# Patient Record
Sex: Female | Born: 1990 | Race: Black or African American | Hispanic: No | Marital: Single | Smoking: Never smoker
Health system: Southern US, Community
[De-identification: ages and names within clinical notes are randomized; demographics above are authoritative.]

## PROBLEM LIST (undated history)

## (undated) DIAGNOSIS — Q631 Lobulated, fused and horseshoe kidney: Secondary | ICD-10-CM

## (undated) DIAGNOSIS — F419 Anxiety disorder, unspecified: Secondary | ICD-10-CM

## (undated) DIAGNOSIS — Q520 Congenital absence of vagina: Secondary | ICD-10-CM

## (undated) DIAGNOSIS — F32A Depression, unspecified: Secondary | ICD-10-CM

## (undated) DIAGNOSIS — F329 Major depressive disorder, single episode, unspecified: Secondary | ICD-10-CM

## (undated) HISTORY — PX: DIAGNOSTIC LAPAROSCOPY: SUR761

## (undated) HISTORY — PX: OVARIAN CYST REMOVAL: SHX89

---

## 2010-09-19 ENCOUNTER — Emergency Department (HOSPITAL_COMMUNITY): Admission: EM | Admit: 2010-09-19 | Discharge: 2010-09-19 | Payer: Self-pay | Admitting: Emergency Medicine

## 2010-09-21 ENCOUNTER — Emergency Department (HOSPITAL_COMMUNITY): Admission: EM | Admit: 2010-09-21 | Discharge: 2010-09-21 | Payer: Self-pay | Admitting: Emergency Medicine

## 2011-05-01 ENCOUNTER — Emergency Department (HOSPITAL_COMMUNITY)
Admission: EM | Admit: 2011-05-01 | Discharge: 2011-05-01 | Disposition: A | Discharge status: Home / Self Care | Payer: BC Managed Care – PPO | Attending: Emergency Medicine | Admitting: Emergency Medicine

## 2011-05-01 DIAGNOSIS — Q059 Spina bifida, unspecified: Secondary | ICD-10-CM | POA: Insufficient documentation

## 2011-05-01 DIAGNOSIS — R059 Cough, unspecified: Secondary | ICD-10-CM | POA: Insufficient documentation

## 2011-05-01 DIAGNOSIS — R05 Cough: Secondary | ICD-10-CM | POA: Insufficient documentation

## 2011-05-01 DIAGNOSIS — R071 Chest pain on breathing: Secondary | ICD-10-CM | POA: Insufficient documentation

## 2011-05-02 ENCOUNTER — Emergency Department (HOSPITAL_COMMUNITY): Payer: BC Managed Care – PPO

## 2011-05-02 ENCOUNTER — Emergency Department (HOSPITAL_COMMUNITY)
Admission: EM | Admit: 2011-05-02 | Discharge: 2011-05-02 | Disposition: A | Discharge status: Home / Self Care | Payer: BC Managed Care – PPO | Attending: Emergency Medicine | Admitting: Emergency Medicine

## 2011-05-02 DIAGNOSIS — R059 Cough, unspecified: Secondary | ICD-10-CM | POA: Insufficient documentation

## 2011-05-02 DIAGNOSIS — J069 Acute upper respiratory infection, unspecified: Secondary | ICD-10-CM | POA: Insufficient documentation

## 2011-05-02 DIAGNOSIS — R05 Cough: Secondary | ICD-10-CM | POA: Insufficient documentation

## 2011-05-02 DIAGNOSIS — R071 Chest pain on breathing: Secondary | ICD-10-CM | POA: Insufficient documentation

## 2011-05-02 DIAGNOSIS — Q059 Spina bifida, unspecified: Secondary | ICD-10-CM | POA: Insufficient documentation

## 2011-05-02 DIAGNOSIS — R509 Fever, unspecified: Secondary | ICD-10-CM | POA: Insufficient documentation

## 2011-06-30 ENCOUNTER — Emergency Department (HOSPITAL_COMMUNITY)
Admission: EM | Admit: 2011-06-30 | Discharge: 2011-06-30 | Disposition: A | Discharge status: Home / Self Care | Payer: BC Managed Care – PPO | Attending: Emergency Medicine | Admitting: Emergency Medicine

## 2011-06-30 ENCOUNTER — Emergency Department (HOSPITAL_COMMUNITY): Payer: BC Managed Care – PPO

## 2011-06-30 DIAGNOSIS — R109 Unspecified abdominal pain: Secondary | ICD-10-CM | POA: Insufficient documentation

## 2011-06-30 DIAGNOSIS — R197 Diarrhea, unspecified: Secondary | ICD-10-CM | POA: Insufficient documentation

## 2011-06-30 DIAGNOSIS — N949 Unspecified condition associated with female genital organs and menstrual cycle: Secondary | ICD-10-CM | POA: Insufficient documentation

## 2011-06-30 DIAGNOSIS — Q059 Spina bifida, unspecified: Secondary | ICD-10-CM | POA: Insufficient documentation

## 2011-06-30 DIAGNOSIS — R112 Nausea with vomiting, unspecified: Secondary | ICD-10-CM | POA: Insufficient documentation

## 2011-06-30 LAB — POCT I-STAT, CHEM 8
BUN: 5 mg/dL — ABNORMAL LOW (ref 6–23)
Chloride: 107 mEq/L (ref 96–112)
Creatinine, Ser: 0.7 mg/dL (ref 0.50–1.10)
Glucose, Bld: 118 mg/dL — ABNORMAL HIGH (ref 70–99)
Potassium: 4.3 mEq/L (ref 3.5–5.1)
Sodium: 141 mEq/L (ref 135–145)

## 2011-06-30 MED ORDER — IOHEXOL 300 MG/ML  SOLN
100.0000 mL | Freq: Once | INTRAMUSCULAR | Status: AC | PRN
  Administered 2011-06-30: 100 mL via INTRAVENOUS

## 2012-03-12 ENCOUNTER — Emergency Department (HOSPITAL_COMMUNITY)
Admission: EM | Admit: 2012-03-12 | Discharge: 2012-03-12 | Disposition: A | Discharge status: Home / Self Care | Payer: BC Managed Care – PPO | Attending: Emergency Medicine | Admitting: Emergency Medicine

## 2012-03-12 ENCOUNTER — Encounter (HOSPITAL_COMMUNITY): Payer: Self-pay | Admitting: Emergency Medicine

## 2012-03-12 DIAGNOSIS — J040 Acute laryngitis: Secondary | ICD-10-CM | POA: Insufficient documentation

## 2012-03-12 LAB — RAPID STREP SCREEN (MED CTR MEBANE ONLY): Streptococcus, Group A Screen (Direct): NEGATIVE

## 2012-03-12 MED ORDER — ALBUTEROL SULFATE HFA 108 (90 BASE) MCG/ACT IN AERS
2.0000 | INHALATION_SPRAY | RESPIRATORY_TRACT | Status: DC | PRN
  Administered 2012-03-12: 2 via RESPIRATORY_TRACT
  Filled 2012-03-12: qty 6.7

## 2012-03-12 NOTE — ED Notes (Signed)
Pt c/o HA, sore throat and SOB since last night, states SOB has resolved, denies pain on arrival, NAD

## 2012-03-12 NOTE — Discharge Instructions (Signed)
Laryngitis Your exam shows you have laryngitis. This condition is due to inflammation around the vocal cords and causes hoarseness and cough. Laryngitis can often be related to a virus infection, excessive smoking, excessive talking or yelling, breathing toxic fumes, allergies, or reflux of acid from your stomach. Treatment is mainly voice rest. Talk as little as possible (this includes whispering). Use written notes to communicate until your voice is back to normal. Avoid smoking cigarettes, drink plenty of clear liquids, and rest frequently until all your symptoms have improved. You should be checked by your doctor if your hoarseness and cough are not improved after 5 days. Please see your doctor or go to the emergency right away if you have:  Trouble breathing or blood in your sputum.   Difficulty swallowing, persistent fever or increasing pain.  Document Released: 11/11/2005 Document Revised: 07/24/2011 Document Reviewed: 04/29/2007 [facility] Patient Information 2012 [place], [place].  Salt Water Gargle This solution will help make your mouth and throat feel better. HOME CARE INSTRUCTIONS   Mix 1 teaspoon of salt in 8 ounces of warm water.   Gargle with this solution as much or often as you need or as directed. Swish and gargle gently if you have any sores or wounds in your mouth.   Do not swallow this mixture.  Document Released: 08/15/2004 Document Revised: 10/31/2011 Document Reviewed: 01/06/2009 [facility] Patient Information 2012 [place], [place].  Take 2 puff of albuterol inhaler every 4 hours as needed for shortness of breath.     RESOURCE GUIDE  Dental Problems  Patients with Medicaid: [facility]                     [phone] [address]                                           Phone:  [phone]                                                  If unable to pay or uninsured, contact:  [facility] or [facility]. to become qualified for the  adult dental clinic.  Chronic Pain Problems Contact [facility]  [phone] Patients need to be referred by their primary care doctor.  Insufficient Money for Medicine Contact United Way:  call "211" or [facility] (769)243-4785.  No Primary Care Doctor Call [facility]  [phone] Other agencies that provide inexpensive medical care    [facility]  [phone]    [facility]  [phone]    [facility]  [phone]    [facility]  [phone]    [facility]  [phone]    [facility]  [phone]  Substance Abuse Resources [facility]  [phone] [facility] [phone] [facility] [phone] Floydene Flock [phone] Residential & Outpatient Substance Abuse Program  [phone]  Psychological Services [facility]  [phone] [facility]  [phone] [facility]   [phone] (emergency services [phone])  Abuse/Neglect [facility] Child Abuse Hotline (351)405-4891 [facility] Child Abuse Hotline 202-830-1170 (After Hours)  Emergency Shelter [facility] (801) [phone]  Maternity Homes Room at the [facility] [place] [phone] [employer] Services [phone]  MRSA Hotline #:   208-157-1269    [facility] Resources  [facility]  United [employer]                           [facility] Dept. [address] [place]                     [address]         [address]  Blondell Reveal Phone:  [phone]                                  Phone:  [phone]                   Phone:  [phone]  [facility] Mental Health Phone:  [phone]  [facility] Child Abuse Hotline [phone] (After Hours)

## 2012-03-12 NOTE — ED Provider Notes (Signed)
History     CSN: [id number]  Arrival date & time 03/12/12  4098   First MD Initiated Contact with Patient 03/12/12 1006      Chief Complaint  Patient presents with  . Sore Throat    (Consider location/radiation/quality/duration/timing/severity/associated sxs/prior treatment) The history is provided by the patient. No language interpreter was used.    21 year old female presents with chief complaints of sore throat. Patient states for the past 4 days she has not been feeling good. Initially she was having a throbbing headache affecting her left side of head that lasted for about 2 days it has resolved. She then noticed throat irritation and sore throat. Symptoms are worsening with swallowing. Symptoms improved with drinking honey.  Onset is gradual, and persistent, she also states she is losing her voice. Patient also has several bouts of nonbloody diarrhea 2 days ago but that has resolved. Last night she experiencing chest congestion with increased shortness of breath. This is nonexertional. She has subjective fever and chills. Patient denies chest pain, nausea, vomiting, abdominal pain, dysuria. She denies sneezing, coughing, runny nose, or ear pain. Patient has a change in her activities. She also denies taking birth control pill, recent surgery, or prolonged bed rest. She denies calf pain or leg swelling. She denies any environmental changes.  No past medical history on file.  No past surgical history on file.  No family history on file.  History  Substance Use Topics  . Smoking status: Not on file  . Smokeless tobacco: Not on file  . Alcohol Use: Not on file    OB History    No data available      Review of Systems  All other systems reviewed and are negative.    Allergies  Review of patient's allergies indicates not on file.  Home Medications  No current outpatient prescriptions on file.  BP 122/90  Pulse 68  Temp(Src) 97.5 F (36.4 C) (Oral)  Resp 22  SpO2  99%  Physical Exam  Nursing note and vitals reviewed. Constitutional: She is oriented to person, place, and time. She appears well-developed and well-nourished. No distress.       Awake, alert, nontoxic appearance  HENT:  Head: Normocephalic and atraumatic.  Right Ear: External ear normal.  Left Ear: External ear normal.  Nose: Nose normal.  Mouth/Throat: Oropharynx is clear and moist. No oropharyngeal exudate.  Eyes: Conjunctivae are normal. Right eye exhibits no discharge. Left eye exhibits no discharge.  Neck: Neck supple.  Cardiovascular: Normal rate and regular rhythm.   Pulmonary/Chest: Effort normal. No respiratory distress. She has no wheezes. She has no rales. She exhibits no tenderness.  Abdominal: Soft. There is no tenderness. There is no rebound.  Musculoskeletal: She exhibits no tenderness.       ROM appears intact, no obvious focal weakness  Lymphadenopathy:    She has no cervical adenopathy.  Neurological: She is alert and oriented to person, place, and time.       Mental status and motor strength appears intact  Skin: Skin is warm. No rash noted.  Psychiatric: She has a normal mood and affect.    ED Course  Procedures (including critical care time)  Labs Reviewed - No data to display No results found.   No diagnosis found.  Results for orders placed during the hospital encounter of 03/12/12  RAPID STREP SCREEN      Component Value Range   Streptococcus, Group A Screen (Direct) NEGATIVE  NEGATIVE    No  results found.    MDM  Patient presents for sore throat, throat examination was unremarkable. No evidence of PTA, or Ludwig's angina.  Patient does sound a bit hoarsed.  Initially i plan to give prednisone and magic mouthwash, but there are possibility of allergic rxn.  Pt refuse prednisone.  I recommend gargle with salt water.  Will obtain rapid strep test.  Lung examination were unremarkable, pt in NAD resp distress and satting @ 99% on RA.  Will give  albuterol HFA as needed.    Pt voice understanding.          Fayrene Helper, PA-C 03/12/12 1102

## 2012-03-13 NOTE — ED Provider Notes (Signed)
Medical screening examination/treatment/procedure(s) were performed by non-physician practitioner and as supervising physician I was immediately available for consultation/collaboration.  Raeford Razor, MD 03/13/12 [phone]

## 2012-07-13 ENCOUNTER — Encounter (HOSPITAL_COMMUNITY): Payer: Self-pay | Admitting: *Deleted

## 2012-07-13 ENCOUNTER — Inpatient Hospital Stay (HOSPITAL_COMMUNITY)
Admission: AD | Admit: 2012-07-13 | Discharge: 2012-07-13 | Disposition: A | Discharge status: Home / Self Care | Payer: BC Managed Care – PPO | Source: Ambulatory Visit | Attending: Obstetrics & Gynecology | Admitting: Obstetrics & Gynecology

## 2012-07-13 DIAGNOSIS — R300 Dysuria: Secondary | ICD-10-CM | POA: Insufficient documentation

## 2012-07-13 DIAGNOSIS — N390 Urinary tract infection, site not specified: Secondary | ICD-10-CM | POA: Insufficient documentation

## 2012-07-13 DIAGNOSIS — R109 Unspecified abdominal pain: Secondary | ICD-10-CM | POA: Insufficient documentation

## 2012-07-13 HISTORY — DX: Lobulated, fused and horseshoe kidney: Q63.1

## 2012-07-13 HISTORY — DX: Congenital absence of vagina: Q52.0

## 2012-07-13 LAB — URINALYSIS, ROUTINE W REFLEX MICROSCOPIC
Glucose, UA: NEGATIVE mg/dL
Nitrite: NEGATIVE
Protein, ur: 30 mg/dL — AB
pH: 7 (ref 5.0–8.0)

## 2012-07-13 LAB — URINE MICROSCOPIC-ADD ON

## 2012-07-13 MED ORDER — SULFAMETHOXAZOLE-TRIMETHOPRIM 800-160 MG PO TABS: 1.0000 | ORAL_TABLET | Freq: Two times a day (BID) | ORAL | Status: AC

## 2012-07-13 NOTE — MAU Provider Note (Signed)
History     CSN: [id number]  Arrival date and time: 07/13/12 4098   First Provider Initiated Contact with Patient 07/13/12 2142      Chief Complaint  Patient presents with  . Urinary Tract Infection   HPI 21 y.o. presents to MAU with suprapubic pain and dysuria x2 days.  She denies h/a, dizziness, or fever/chills.  Pt has hx of vaginal agenesis.     Past Medical History  Diagnosis Date  . Spina bifida   . Congenital fusion of kidneys   . Vaginal agenesis, total     Past Surgical History  Procedure Date  . Diagnostic laparoscopy   . Ovarian cyst removal     History reviewed. No pertinent family history.  History  Substance Use Topics  . Smoking status: Never Smoker   . Smokeless tobacco: Not on file  . Alcohol Use: No    Allergies:  Allergies  Allergen Reactions  . Fentanyl Anaphylaxis  . Midazolam Hcl Anaphylaxis  . Antihistamines, Chlorpheniramine-Type Other (See Comments)    Hallucinate   . Oxycodone Hcl Er Other (See Comments)    Hallucinations     Prescriptions prior to admission  Medication Sig Dispense Refill  . OVER THE COUNTER MEDICATION Take 1 tablet by mouth daily. Whole [employer] Vegan Multivitamin.        Review of Systems  Constitutional: Negative for fever, chills and malaise/fatigue.  Eyes: Negative for blurred vision.  Respiratory: Negative for cough and shortness of breath.   Cardiovascular: Negative for chest pain.  Gastrointestinal: Positive for abdominal pain. Negative for heartburn, nausea and vomiting.  Genitourinary: Positive for dysuria, urgency, frequency and hematuria.  Musculoskeletal: Negative.   Neurological: Negative for dizziness and headaches.  Psychiatric/Behavioral: Negative for depression.   Physical Exam   Blood pressure 115/76, pulse 65, temperature 97.9 F (36.6 C), temperature source Oral, resp. rate 18, height 5\' 4"  (1.626 m), weight 69.854 kg (154 lb), SpO2 100.00%.  Physical Exam  Nursing note and  vitals reviewed. Constitutional: She is oriented to person, place, and time. She appears well-developed and well-nourished.  Neck: Normal range of motion.  Cardiovascular: Normal rate, regular rhythm and normal heart sounds.   Respiratory: Effort normal.  GI: Soft. There is tenderness.       Suprapubic tenderness  Musculoskeletal: Normal range of motion.  Neurological: She is alert and oriented to person, place, and time.  Skin: Skin is warm and dry.  Psychiatric: She has a normal mood and affect. Her behavior is normal. Judgment and thought content normal.  Negative CVA tenderness  Results for orders placed during the hospital encounter of 07/13/12 (from the past 24 hour(s))  URINALYSIS, ROUTINE W REFLEX MICROSCOPIC     Status: Abnormal   Collection Time   07/13/12  7:30 PM      Component Value Range   Color, Urine YELLOW  YELLOW   APPearance HAZY (*) CLEAR   Specific Gravity, Urine 1.020  1.005 - 1.030   pH 7.0  5.0 - 8.0   Glucose, UA NEGATIVE  NEGATIVE mg/dL   Hgb urine dipstick LARGE (*) NEGATIVE   Bilirubin Urine NEGATIVE  NEGATIVE   Ketones, ur NEGATIVE  NEGATIVE mg/dL   Protein, ur 30 (*) NEGATIVE mg/dL   Urobilinogen, UA 0.2  0.0 - 1.0 mg/dL   Nitrite NEGATIVE  NEGATIVE   Leukocytes, UA MODERATE (*) NEGATIVE  URINE MICROSCOPIC-ADD ON     Status: Abnormal   Collection Time   07/13/12  7:30 PM  Component Value Range   Squamous Epithelial / LPF FEW (*) RARE   WBC, UA TOO NUMEROUS TO COUNT  <3 WBC/hpf   RBC / HPF TOO NUMEROUS TO COUNT  <3 RBC/hpf   Bacteria, UA RARE  RARE   Urine-Other MUCOUS PRESENT     MAU Course  Procedures   Assessment and Plan  Uncomplicated UTI  D/C home Bactrim DS x3 days Drink plenty of fluids Urine sent for culture Return to MAU as needed  LEFTWICH-KIRBY, Hatem Cull 07/13/2012, 9:51 PM

## 2012-07-13 NOTE — MAU Note (Signed)
Pt in c/o constant pressure and pain when urinating.  Reports blood in urine.  States symptoms started 2-3 days ago.  Does not have a uterus or vaginal opening.  Denies any discharge.

## 2012-07-13 NOTE — MAU Note (Signed)
Pt reports "i believe i have a UTI", states she has never had a period due to medical condition (see past medical history). Blood in urine today and lower abd pressure

## 2012-07-14 NOTE — MAU Provider Note (Signed)
Medical Screening exam and patient care preformed by advanced practice provider.  Agree with the above management.  

## 2012-07-16 ENCOUNTER — Inpatient Hospital Stay (HOSPITAL_COMMUNITY)
Admission: AD | Admit: 2012-07-16 | Discharge: 2012-07-16 | Disposition: A | Discharge status: Home / Self Care | Payer: BC Managed Care – PPO | Source: Ambulatory Visit | Attending: Obstetrics & Gynecology | Admitting: Obstetrics & Gynecology

## 2012-07-16 ENCOUNTER — Encounter (HOSPITAL_COMMUNITY): Payer: Self-pay | Admitting: *Deleted

## 2012-07-16 ENCOUNTER — Inpatient Hospital Stay (HOSPITAL_COMMUNITY): Payer: BC Managed Care – PPO

## 2012-07-16 DIAGNOSIS — Q520 Congenital absence of vagina: Secondary | ICD-10-CM | POA: Insufficient documentation

## 2012-07-16 DIAGNOSIS — R109 Unspecified abdominal pain: Secondary | ICD-10-CM | POA: Insufficient documentation

## 2012-07-16 DIAGNOSIS — R1084 Generalized abdominal pain: Secondary | ICD-10-CM

## 2012-07-16 DIAGNOSIS — R110 Nausea: Secondary | ICD-10-CM | POA: Insufficient documentation

## 2012-07-16 DIAGNOSIS — Q631 Lobulated, fused and horseshoe kidney: Secondary | ICD-10-CM

## 2012-07-16 DIAGNOSIS — Q638 Other specified congenital malformations of kidney: Secondary | ICD-10-CM | POA: Insufficient documentation

## 2012-07-16 DIAGNOSIS — M7918 Myalgia, other site: Secondary | ICD-10-CM

## 2012-07-16 LAB — URINALYSIS, ROUTINE W REFLEX MICROSCOPIC
Bilirubin Urine: NEGATIVE
Glucose, UA: NEGATIVE mg/dL
Hgb urine dipstick: NEGATIVE
Specific Gravity, Urine: 1.015 (ref 1.005–1.030)
Urobilinogen, UA: 0.2 mg/dL (ref 0.0–1.0)

## 2012-07-16 MED ORDER — ONDANSETRON 4 MG PO TBDP
4.0000 mg | ORAL_TABLET | Freq: Once | ORAL | Status: AC
  Administered 2012-07-16: 4 mg via ORAL
  Filled 2012-07-16: qty 1

## 2012-07-16 NOTE — MAU Note (Signed)
Pt states she was here 3 days ago and given Sulfa for a UTI-she has taken a total of 6 doses-states she has been nauseated the whole time but today around 1015 started having abd pain that is constant

## 2012-07-16 NOTE — MAU Provider Note (Signed)
Chief Complaint: Abdominal Pain   First Provider Initiated Contact with Patient 07/16/12 2005     SUBJECTIVE HPI: Jacqueline Andrews is a 21 y.o. who presents with diffuse constant lower abd pain x 1d and nausea x 3d since beginning tx with Bactrim for uncomplicated UTI dx here 3d ago. 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 The pain is throbbing and alleviated somewhat after voiding.Feels similar t owhen she had an ovarian cyst.  Urinary frequency, urgency, hematuria resolved. She is eating normally. Normal BMs.   Past Medical History  Diagnosis Date  . Spina bifida   . Vaginal agenesis, total   . Congenital fusion of kidneys   No hx UTIs  OB History    Grav Para Term Preterm Abortions TAB SAB Ect Mult Living                 Past Surgical History  Procedure Date  . Diagnostic laparoscopy   . Ovarian cyst removal    History   Social History  . Marital Status: Single    Spouse Name: N/A    Number of Children: N/A  . Years of Education: N/A   Occupational History  . Not on file.   Social History Main Topics  . Smoking status: Never Smoker   . Smokeless tobacco: Not on file  . Alcohol Use: No  . Drug Use: No  . Sexually Active: No   Other Topics Concern  . Not on file   Social History Narrative  . No narrative on file   No current facility-administered medications on file prior to encounter.   Current Outpatient Prescriptions on File Prior to Encounter  Medication Sig Dispense Refill  . OVER THE COUNTER MEDICATION Take 1 tablet by mouth daily. [employer] Vegan Multivitamin.      Marland Kitchen sulfamethoxazole-trimethoprim (BACTRIM DS,SEPTRA DS) 800-160 MG per tablet Take 1 tablet by mouth 2 (two) times daily.  6 tablet  0   Allergies  Allergen Reactions  . Fentanyl Anaphylaxis  . Midazolam Hcl Anaphylaxis  . Antihistamines, Chlorpheniramine-Type Other (See Comments)    Hallucinate   . Oxycodone Hcl Er Other (See Comments)   Hallucinations   . Sulfa Antibiotics Nausea Only    ROS: Pertinent items in HPI  OBJECTIVE Blood pressure 120/86, pulse 70, temperature 97.7 F (36.5 C), temperature source Oral, resp. rate 20, height 5\' 4"  (1.626 m), weight 69.854 kg (154 lb), SpO2 100.00%. GENERAL: Well-developed, well-nourished female in no acute distress.  HEENT: Normocephalic HEART: normal rate RESP: normal effort ABDOMEN: Soft, nl BS,non-tender, no guarding or rebound EXTREMITIES: Nontender, no edema NEURO: Alert and oriented   LAB RESULTS Results for orders placed during the hospital encounter of 07/16/12 (from the past 24 hour(s))  URINALYSIS, ROUTINE W REFLEX MICROSCOPIC     Status: Normal   Collection Time   07/16/12  8:41 PM      Component Value Range   Color, Urine YELLOW  YELLOW   APPearance CLEAR  CLEAR   Specific Gravity, Urine 1.015  1.005 - 1.030   pH 7.0  5.0 - 8.0   Glucose, UA NEGATIVE  NEGATIVE mg/dL   Hgb urine dipstick NEGATIVE  NEGATIVE   Bilirubin Urine NEGATIVE  NEGATIVE   Ketones, ur NEGATIVE  NEGATIVE mg/dL   Protein, ur NEGATIVE  NEGATIVE mg/dL   Urobilinogen, UA 0.2  0.0 - 1.0 mg/dL   Nitrite NEGATIVE  NEGATIVE   Leukocytes, UA NEGATIVE  NEGATIVE  IMAGING  US Pelvis Complete  07/16/2012  *RADIOLOGY REPORT*  Clinical Data: Pelvic pain.  Genitourinary anomaly.  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  Comparison:  CT 06/30/2011  Findings:  Uterus:  Congenitally absent  Endometrium: Absent  Right ovary: 3.9 x 2.1 x 2.7 cm.  Normal  Left ovary: Not visualized  Other Findings:  No free fluid.  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  No hydronephrosis is present.  IMPRESSION: No acute  abnormality.   Original Report Authenticated By: Camelia Phenes, M.D.    ASSESSMENT No diagnosis found.  PLANf Discharge home Medication List  As of 07/16/2012 11:14 PM   TAKE these medications         OVER THE COUNTER MEDICATION   Take 1 tablet by mouth daily. [employer] Vegan Multivitamin.      sulfamethoxazole-trimethoprim 800-160 MG per tablet   Commonly known as: BACTRIM DS,SEPTRA DS   Take 1 tablet by mouth 2 (two) times daily.          May D/C antibiotic Acetaminophen up to 4 gm/d as needed F/U with PMD. [facility] if worse   Danae Orleans, CNM 07/16/2012  8:27 PM

## 2013-03-06 ENCOUNTER — Encounter (HOSPITAL_COMMUNITY): Payer: Self-pay | Admitting: *Deleted

## 2013-03-06 ENCOUNTER — Emergency Department (HOSPITAL_COMMUNITY)
Admission: EM | Admit: 2013-03-06 | Discharge: 2013-03-07 | Disposition: A | Discharge status: Home / Self Care | Payer: BC Managed Care – PPO | Attending: Emergency Medicine | Admitting: Emergency Medicine

## 2013-03-06 DIAGNOSIS — R1013 Epigastric pain: Secondary | ICD-10-CM

## 2013-03-06 DIAGNOSIS — Q638 Other specified congenital malformations of kidney: Secondary | ICD-10-CM | POA: Insufficient documentation

## 2013-03-06 DIAGNOSIS — Q520 Congenital absence of vagina: Secondary | ICD-10-CM | POA: Insufficient documentation

## 2013-03-06 DIAGNOSIS — R110 Nausea: Secondary | ICD-10-CM | POA: Insufficient documentation

## 2013-03-06 DIAGNOSIS — R42 Dizziness and giddiness: Secondary | ICD-10-CM | POA: Insufficient documentation

## 2013-03-06 LAB — URINALYSIS, ROUTINE W REFLEX MICROSCOPIC
Bilirubin Urine: NEGATIVE
Glucose, UA: NEGATIVE mg/dL
Hgb urine dipstick: NEGATIVE
Ketones, ur: NEGATIVE mg/dL
Leukocytes, UA: NEGATIVE
Nitrite: NEGATIVE
Protein, ur: NEGATIVE mg/dL
Specific Gravity, Urine: 1.036 — ABNORMAL HIGH (ref 1.005–1.030)
Urobilinogen, UA: 1 mg/dL (ref 0.0–1.0)
pH: 6.5 (ref 5.0–8.0)

## 2013-03-06 LAB — PREGNANCY, URINE: Preg Test, Ur: NEGATIVE

## 2013-03-06 MED ORDER — SODIUM CHLORIDE 0.9 % IV BOLUS (SEPSIS)
1000.0000 mL | Freq: Once | INTRAVENOUS | Status: AC
  Administered 2013-03-07: 1000 mL via INTRAVENOUS

## 2013-03-06 MED ORDER — HYDROMORPHONE HCL PF 1 MG/ML IJ SOLN: 1.0000 mg | Freq: Once | INTRAMUSCULAR | Status: DC

## 2013-03-06 MED ORDER — ONDANSETRON HCL 4 MG/2ML IJ SOLN: 4.0000 mg | Freq: Once | INTRAMUSCULAR | Status: DC

## 2013-03-06 NOTE — ED Notes (Signed)
Pt states she has had mid-upper abdominal pain since this morning at 2 a.m.  Pt used an enema to have a BM today, with no relief.  Pt has hx of constipation.  Pt describes the pain as intermittent and throbbing.  Pt states pain was sharp in nature when it first began.  Pt endorses nausea, but denies vomiting.  Pt denies diarrhea.  Pt states she was dizzy earlier this evening.  Pt denies pain with urination or hematuria.

## 2013-03-07 ENCOUNTER — Emergency Department (HOSPITAL_COMMUNITY): Payer: BC Managed Care – PPO

## 2013-03-07 LAB — CBC WITH DIFFERENTIAL/PLATELET
Basophils Absolute: 0 10*3/uL (ref 0.0–0.1)
Basophils Relative: 0 % (ref 0–1)
Eosinophils Absolute: 0 10*3/uL (ref 0.0–0.7)
Eosinophils Relative: 1 % (ref 0–5)
HCT: 35.7 % — ABNORMAL LOW (ref 36.0–46.0)
Hemoglobin: 12.7 g/dL (ref 12.0–15.0)
Lymphocytes Relative: 27 % (ref 12–46)
Lymphs Abs: 1.4 10*3/uL (ref 0.7–4.0)
MCH: 32.2 pg (ref 26.0–34.0)
MCHC: 35.6 g/dL (ref 30.0–36.0)
MCV: 90.4 fL (ref 78.0–100.0)
Monocytes Absolute: 0.5 10*3/uL (ref 0.1–1.0)
Monocytes Relative: 11 % (ref 3–12)
Neutro Abs: 3.2 10*3/uL (ref 1.7–7.7)
Neutrophils Relative %: 62 % (ref 43–77)
Platelets: 296 10*3/uL (ref 150–400)
RBC: 3.95 MIL/uL (ref 3.87–5.11)
RDW: 12.5 % (ref 11.5–15.5)
WBC: 5.2 10*3/uL (ref 4.0–10.5)

## 2013-03-07 LAB — COMPREHENSIVE METABOLIC PANEL
ALT: 20 U/L (ref 0–35)
AST: 27 U/L (ref 0–37)
Albumin: 3.5 g/dL (ref 3.5–5.2)
Alkaline Phosphatase: 80 U/L (ref 39–117)
BUN: 7 mg/dL (ref 6–23)
CO2: 25 mEq/L (ref 19–32)
Calcium: 8.8 mg/dL (ref 8.4–10.5)
Chloride: 101 mEq/L (ref 96–112)
Creatinine, Ser: 0.77 mg/dL (ref 0.50–1.10)
GFR calc Af Amer: >90 mL/min (ref >90)
GFR calc non Af Amer: >90 mL/min (ref >90)
Glucose, Bld: 98 mg/dL (ref 70–99)
Potassium: 3.8 mEq/L (ref 3.5–5.1)
Sodium: 135 mEq/L (ref 135–145)
Total Bilirubin: 0.2 mg/dL — ABNORMAL LOW (ref 0.3–1.2)
Total Protein: 7.6 g/dL (ref 6.0–8.3)

## 2013-03-07 LAB — LIPASE, BLOOD: Lipase: 35 U/L (ref 11–59)

## 2013-03-07 MED ORDER — FAMOTIDINE 20 MG PO TABS: 20.0000 mg | ORAL_TABLET | Freq: Two times a day (BID) | ORAL | Status: DC

## 2013-03-07 MED ORDER — ACETAMINOPHEN 325 MG PO TABS
650.0000 mg | ORAL_TABLET | Freq: Once | ORAL | Status: AC
  Administered 2013-03-07: 650 mg via ORAL
  Filled 2013-03-07: qty 2

## 2013-03-07 NOTE — ED Provider Notes (Signed)
  Physical Exam  BP 115/72  Pulse 87  Temp(Src) 98.9 F (37.2 C) (Oral)  Resp 18  SpO2 99%  Physical Exam *E. patient's ultrasound ED Course  Procedures  MDM Ultrasound reviewed reveals no gallstones.  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

## 2013-03-07 NOTE — ED Provider Notes (Signed)
Medical screening examination/treatment/procedure(s) were performed by non-physician practitioner and as supervising physician I was immediately available for consultation/collaboration.  Jones Skene, M.D.     Jones Skene, MD 03/07/13 [phone]

## 2013-03-07 NOTE — ED Provider Notes (Signed)
History     CSN: [id number]  Arrival date & time 03/06/13  2234   First MD Initiated Contact with Patient 03/06/13 2306      Chief Complaint  Patient presents with  . Abdominal Pain    (Consider location/radiation/quality/duration/timing/severity/associated sxs/prior treatment) HPI Patient presents to the emergency department with upper abdominal pain began at 2 AM.  Patient, states, that the pain is constant.  She states, that she did have some mild dizziness throughout the day, states, that she also had nausea, but no vomiting, or diarrhea.  Patient denies chest pain, shortness of breath, fever, back pain, dysuria, rash, or syncope.  Patient, states she tried an enema, because she's had a history of constipation, and this did not relieve her symptoms.  Patient, states, that palpation seems to make her pain, worse.  Patient has had multiple abdominal surgeries in the past. Past Medical History  Diagnosis Date  . Spina bifida   . Vaginal agenesis, total   . Congenital fusion of kidneys     Past Surgical History  Procedure Laterality Date  . Diagnostic laparoscopy    . Ovarian cyst removal      No family history on file.  History  Substance Use Topics  . Smoking status: Never Smoker   . Smokeless tobacco: Never Used  . Alcohol Use: No    OB History   Grav Para Term Preterm Abortions TAB SAB Ect Mult Living                  Review of Systems All other systems negative except as documented in the HPI. All pertinent positives and negatives as reviewed in the HPI. Allergies  Fentanyl; Midazolam hcl; Antihistamines, chlorpheniramine-type; Oxycodone hcl er; and Sulfa antibiotics  Home Medications   Current Outpatient Rx  Name  Route  Sig  Dispense  Refill  . OVER THE COUNTER MEDICATION   Oral   Take 1 tablet by mouth daily. [employer] Vegan Multivitamin.           BP 115/72  Pulse 87  Temp(Src) 98.9 F (37.2 C) (Oral)  Resp 18  SpO2 99%  Physical Exam    Nursing note and vitals reviewed. Constitutional: She is oriented to person, place, and time. She appears well-developed and well-nourished. No distress.  HENT:  Head: Normocephalic and atraumatic.  Mouth/Throat: Oropharynx is clear and moist.  Eyes: Pupils are equal, round, and reactive to light.  Neck: Normal range of motion. Neck supple.  Cardiovascular: Normal rate, regular rhythm and normal heart sounds.  Exam reveals no gallop and no friction rub.   No murmur heard. Pulmonary/Chest: Effort normal and breath sounds normal. No respiratory distress.  Abdominal: Soft. Normal appearance and bowel sounds are normal. There is no rigidity, no rebound, no guarding and no CVA tenderness. No hernia.    Neurological: She is alert and oriented to person, place, and time.  Skin: Skin is warm and dry. No rash noted.    ED Course  Procedures (including critical care time)  Labs Reviewed  CBC WITH DIFFERENTIAL - Abnormal; Notable for the following:    HCT 35.7 (*)    All other components within normal limits  COMPREHENSIVE METABOLIC PANEL - Abnormal; Notable for the following:    Total Bilirubin 0.2 (*)    All other components within normal limits  URINALYSIS, ROUTINE W REFLEX MICROSCOPIC - Abnormal; Notable for the following:    Specific Gravity, Urine 1.036 (*)    All other components within  normal limits  PREGNANCY, URINE  LIPASE, BLOOD   Patient will have ultrasound to assess for any gallbladder disease.   MDM  MDM Reviewed: nursing note, vitals and previous chart Interpretation: labs and ultrasound            Carlyle Dolly, PA-C 03/08/13 0006

## 2013-03-07 NOTE — ED Notes (Signed)
Patient is still c/o  Pain in upper abdominal area, awaiting aresults from U/S. C/o  Headache.

## 2013-03-08 NOTE — ED Provider Notes (Signed)
Medical screening examination/treatment/procedure(s) were performed by non-physician practitioner and as supervising physician I was immediately available for consultation/collaboration.  John-Adam Neysa Arts, M.D.     John-Adam Shamonica Schadt, MD 03/08/13 0814 

## 2013-09-22 ENCOUNTER — Emergency Department (HOSPITAL_COMMUNITY)
Admission: EM | Admit: 2013-09-22 | Discharge: 2013-09-22 | Disposition: A | Discharge status: Other Acute Inpatient Hospital | Payer: BC Managed Care – PPO | Attending: Emergency Medicine | Admitting: Emergency Medicine

## 2013-09-22 ENCOUNTER — Encounter (HOSPITAL_COMMUNITY): Payer: Self-pay | Admitting: Emergency Medicine

## 2013-09-22 DIAGNOSIS — R42 Dizziness and giddiness: Secondary | ICD-10-CM | POA: Insufficient documentation

## 2013-09-22 DIAGNOSIS — Q520 Congenital absence of vagina: Secondary | ICD-10-CM | POA: Insufficient documentation

## 2013-09-22 DIAGNOSIS — R45851 Suicidal ideations: Secondary | ICD-10-CM | POA: Insufficient documentation

## 2013-09-22 DIAGNOSIS — F329 Major depressive disorder, single episode, unspecified: Secondary | ICD-10-CM

## 2013-09-22 DIAGNOSIS — Q638 Other specified congenital malformations of kidney: Secondary | ICD-10-CM | POA: Insufficient documentation

## 2013-09-22 DIAGNOSIS — F332 Major depressive disorder, recurrent severe without psychotic features: Secondary | ICD-10-CM | POA: Insufficient documentation

## 2013-09-22 DIAGNOSIS — R5381 Other malaise: Secondary | ICD-10-CM | POA: Insufficient documentation

## 2013-09-22 DIAGNOSIS — T1491XA Suicide attempt, initial encounter: Secondary | ICD-10-CM

## 2013-09-22 DIAGNOSIS — Z3202 Encounter for pregnancy test, result negative: Secondary | ICD-10-CM | POA: Insufficient documentation

## 2013-09-22 DIAGNOSIS — Q059 Spina bifida, unspecified: Secondary | ICD-10-CM | POA: Insufficient documentation

## 2013-09-22 DIAGNOSIS — F32A Depression, unspecified: Secondary | ICD-10-CM

## 2013-09-22 DIAGNOSIS — F411 Generalized anxiety disorder: Secondary | ICD-10-CM | POA: Insufficient documentation

## 2013-09-22 DIAGNOSIS — Z79899 Other long term (current) drug therapy: Secondary | ICD-10-CM | POA: Insufficient documentation

## 2013-09-22 HISTORY — DX: Anxiety disorder, unspecified: F41.9

## 2013-09-22 HISTORY — DX: Major depressive disorder, single episode, unspecified: F32.9

## 2013-09-22 HISTORY — DX: Depression, unspecified: F32.A

## 2013-09-22 LAB — CBC
Hemoglobin: 13 g/dL (ref 12.0–15.0)
MCH: 31.3 pg (ref 26.0–34.0)
MCV: 91.3 fL (ref 78.0–100.0)
Platelets: 348 10*3/uL (ref 150–400)
RBC: 4.16 MIL/uL (ref 3.87–5.11)
WBC: 10.1 10*3/uL (ref 4.0–10.5)

## 2013-09-22 LAB — RAPID URINE DRUG SCREEN, HOSP PERFORMED
Amphetamines: NOT DETECTED
Barbiturates: NOT DETECTED
Tetrahydrocannabinol: NOT DETECTED

## 2013-09-22 LAB — COMPREHENSIVE METABOLIC PANEL
ALT: 21 U/L (ref 0–35)
AST: 24 U/L (ref 0–37)
Alkaline Phosphatase: 88 U/L (ref 39–117)
CO2: 24 mEq/L (ref 19–32)
Calcium: 9.6 mg/dL (ref 8.4–10.5)
Chloride: 98 mEq/L (ref 96–112)
GFR calc Af Amer: >90 mL/min (ref >90)
GFR calc non Af Amer: >90 mL/min (ref >90)
Glucose, Bld: 112 mg/dL — ABNORMAL HIGH (ref 70–99)
Sodium: 132 mEq/L — ABNORMAL LOW (ref 135–145)
Total Bilirubin: 0.2 mg/dL — ABNORMAL LOW (ref 0.3–1.2)

## 2013-09-22 LAB — POCT PREGNANCY, URINE: Preg Test, Ur: NEGATIVE

## 2013-09-22 LAB — CARBAMAZEPINE LEVEL, TOTAL: Carbamazepine Lvl: 5 ug/mL (ref 4.0–12.0)

## 2013-09-22 MED ORDER — ZOLPIDEM TARTRATE 5 MG PO TABS: 5.0000 mg | ORAL_TABLET | Freq: Every evening | ORAL | Status: DC | PRN

## 2013-09-22 MED ORDER — NICOTINE 21 MG/24HR TD PT24: 21.0000 mg | MEDICATED_PATCH | Freq: Every day | TRANSDERMAL | Status: DC

## 2013-09-22 MED ORDER — ONDANSETRON HCL 4 MG PO TABS: 4.0000 mg | ORAL_TABLET | Freq: Three times a day (TID) | ORAL | Status: DC | PRN

## 2013-09-22 MED ORDER — IBUPROFEN 200 MG PO TABS: 600.0000 mg | ORAL_TABLET | Freq: Three times a day (TID) | ORAL | Status: DC | PRN

## 2013-09-22 MED ORDER — ACETAMINOPHEN 325 MG PO TABS: 650.0000 mg | ORAL_TABLET | ORAL | Status: DC | PRN

## 2013-09-22 MED ORDER — ALUM & MAG HYDROXIDE-SIMETH 200-200-20 MG/5ML PO SUSP: 30.0000 mL | ORAL | Status: DC | PRN

## 2013-09-22 NOTE — ED Notes (Signed)
Pts tegretol level is negative, no further testing will be done.

## 2013-09-22 NOTE — ED Notes (Signed)
Pt was referred here by [facility], states last night took a bunch of Tegretol tablets trying to commit suicide d/t arguments w/ her boyfriend over her inability to bear children, states last night passed out after taking tablets, unable to say how many she took, pt states at this time she is not suicidal.

## 2013-09-22 NOTE — ED Notes (Signed)
Pt being sent from Dr Lorenda Ishihara Akintayo's office for med clearance. md wants pt admitted for SI

## 2013-09-22 NOTE — Consult Note (Signed)
Pt accepted by [facility] but bed won't be ready until the AM per Minerva Areola [facility].  TTS to follow up in the AM.

## 2013-09-22 NOTE — ED Notes (Signed)
Patient ambulatory with a steady gait. No acute distress noted. 

## 2013-09-22 NOTE — ED Notes (Signed)
Per poison control get Tegretol level, tylenol level and LST levels, repeat in 3 hours, monitor mental status.

## 2013-09-22 NOTE — ED Provider Notes (Signed)
Pt signed out to me by Trixie Dredge, PA-C at shift change. Tegratol is pending, if low or normally pt will be medically cleared to be seen by TTS.  If high, pt will not be medically cleared.  9:03 PM Tegretol level: normal.  Pt is medically cleared to be seen by TTS.   Junius Finner, PA-C 09/22/13 2104

## 2013-09-22 NOTE — BH Assessment (Signed)
Assessment Note  Jacqueline Andrews is an 22 y.o. female.   Pt has long hx of mental health issues.  Pt is currently being treated in outpatient by Dr. Jannifer Franklin and De Nurse.  Dr. Jannifer Franklin recommended pt come to ED for clearance and be admitted to [facility].  PA relayed to TTS as did pt.    Pt overdosed on an unknown amount of tegretol pills in an attempt to commit suicide.  Pt acknowledges last night "I wanted to die and I tried."  Pt denies suicidal intent currently but can't reliably contract for safety either.  "I just don't know what I would do if I leave."  Mother appears supportive of inpatient admission.  Pt denies HI and any abuse hx.  Pt never served in [employer].  Pt currently in college at [employer] and "I find it so stressful and hard.  I have a lot of stress."  Pt also disclosed that the trigger for the suicide attempt was related to news she can't conceive.  Pt reports being withdrawn and depressed and "this has added to my stress."  Mother reports pt attempted suicide in 2010 and went to [facility].  Pt has had no inpatient admits since 2010.  This indicates change talk and capacity for recovery.    MSE by TTS:  Pt made good eye contact, flat affect, Ox3, poor judgment, highly anxious, appeared well nourished, no verbalization of weight loss though loss of appetite was indicated, casual appearance and interacted well with TTS.    Recommendation  Pt needs inpatient admission.  [facility] will consider and advise Dr. Jannifer Franklin on progress of pt.  Mother wants to be involved with treatment and pt is agreeable.  Encourage admission this evening if psychiatry agrees.  Pt does not need to be back on the psych ED with the level of acuity currently present for any length of time.  Axis I: Generalized Anxiety Disorder and Major Depression, Recurrent severe Axis II: Deferred Axis III:  Past Medical History  Diagnosis Date  . Spina bifida   . Vaginal agenesis, total   . Congenital fusion of  kidneys   . Depression   . Anxiety    Axis IV: other psychosocial or environmental problems, problems related to social environment and problems with primary support group Axis V: 41-50 serious symptoms  Past Medical History:  Past Medical History  Diagnosis Date  . Spina bifida   . Vaginal agenesis, total   . Congenital fusion of kidneys   . Depression   . Anxiety     Past Surgical History  Procedure Laterality Date  . Diagnostic laparoscopy    . Ovarian cyst removal      Family History: No family history on file.  Social History:  reports that she has never smoked. She has never used smokeless tobacco. She reports that she does not drink alcohol or use illicit drugs.  Additional Social History:     CIWA: CIWA-Ar BP: 121/82 mmHg Pulse Rate: 75 COWS:    Allergies:  Allergies  Allergen Reactions  . Fentanyl Anaphylaxis  . Midazolam Hcl Anaphylaxis  . Antihistamines, Chlorpheniramine-Type Other (See Comments)    Hallucinate   . Oxycodone Hcl Other (See Comments)    Hallucinations   . Sulfa Antibiotics Nausea Only    Home Medications:  (Not in a hospital admission)  OB/GYN Status:  No LMP recorded. Patient is not currently having periods (Reason: Other).  General Assessment Data Location of Assessment: [facility] Is this a  Tele or Face-to-Face Assessment?: Face-to-Face Is this an Initial Assessment or a Re-assessment for this encounter?: Initial Assessment Living Arrangements: Spouse/significant other Can pt return to current living arrangement?: Yes Admission Status: Voluntary Is patient capable of signing voluntary admission?: Yes Transfer from: Acute Hospital Referral Source: MD  Medical Screening Exam [facility] Walk-in ONLY) Medical Exam completed: Yes  [facility] Crisis Care Plan Living Arrangements: Spouse/significant other Name of Psychiatrist: Dr. Jannifer Franklin Name of Therapist: Ricky Stabs  Education Status Is patient currently in school?: Yes Current  Grade: college Highest grade of school patient has completed: 12 Name of school: [employer] Contact person: pt  Risk to self Suicidal Ideation: Yes-Currently Present Suicidal Intent: Yes-Currently Present Is patient at risk for suicide?: Yes Suicidal Plan?: Yes-Currently Present Specify Current Suicidal Plan: pt overdosed last night and admits to attempt Access to Means: Yes Specify Access to Suicidal Means: has access to meds What has been your use of drugs/alcohol within the last 12 months?: denies Previous Attempts/Gestures: Yes How many times?: 2 Other Self Harm Risks: na Triggers for Past Attempts: Unpredictable;Other (Comment) (depression; long hx of MH issues) Intentional Self Injurious Behavior: None Family Suicide History: No Recent stressful life event(s): Other (Comment) (can't become pregnant; depressed; school; MH issues) Persecutory voices/beliefs?: Yes (not current but hx of) Depression: Yes Depression Symptoms: Insomnia;Tearfulness;Isolating;Fatigue;Guilt;Loss of interest in usual pleasures;Feeling worthless/self pity;Feeling angry/irritable Substance abuse history and/or treatment for substance abuse?: No Suicide prevention information given to non-admitted patients: Not applicable  Risk to Others Homicidal Ideation: No Thoughts of Harm to Others: No Current Homicidal Intent: No Current Homicidal Plan: No Access to Homicidal Means: No Identified Victim: na History of harm to others?: No Assessment of Violence: None Noted Violent Behavior Description: cooperative and polite Does patient have access to weapons?: No Criminal Charges Pending?: No Does patient have a court date: No  Psychosis Hallucinations: Auditory;Visual (not current but hx of) Delusions: Unspecified;Persecutory (not current but hx of)  Mental Status Report Appear/Hygiene: Other (Comment) (casual) Eye Contact: Good Motor Activity: Restlessness Speech: Soft;Logical/coherent Level of  Consciousness: Alert Mood: Depressed;Anxious;Ashamed/humiliated;Sad;Worthless, low self-esteem Affect: Anxious;Angry;Appropriate to circumstance;Depressed;Frightened;Sad Anxiety Level: Severe Thought Processes: Coherent Judgement: Impaired Orientation: Person;Place;Situation;Appropriate for developmental age Obsessive Compulsive Thoughts/Behaviors: Minimal  Cognitive Functioning Concentration: Decreased Memory: Remote Intact;Recent Impaired (doesn't remember a lot about last night) IQ: Average Insight: Poor Impulse Control: Poor Appetite: Fair Weight Loss: 0 Weight Gain: 0 Sleep: Decreased Total Hours of Sleep: 5 (last night slept due to overdose) Vegetative Symptoms: None  ADLScreening [facility] Assessment Services) Patient's cognitive ability adequate to safely complete daily activities?: Yes Patient able to express need for assistance with ADLs?: Yes Independently performs ADLs?: Yes (appropriate for developmental age)  Prior Inpatient Therapy Prior Inpatient Therapy: Yes Prior Therapy Dates: 2010 Prior Therapy Facilty/Provider(s): [facility] Reason for Treatment: suicide  Prior Outpatient Therapy Prior Outpatient Therapy: Yes Prior Therapy Dates: current Prior Therapy Facilty/Provider(s): [facility] ([facility] and De Nurse) Reason for Treatment: med mgt and therapy  ADL Screening (condition at time of admission) Patient's cognitive ability adequate to safely complete daily activities?: Yes Is the patient deaf or have difficulty hearing?: No Does the patient have difficulty seeing, even when wearing glasses/contacts?: No Does the patient have difficulty concentrating, remembering, or making decisions?: No Patient able to express need for assistance with ADLs?: Yes Does the patient have difficulty dressing or bathing?: No Independently performs ADLs?: Yes (appropriate for developmental age) Does the patient have difficulty walking or climbing  stairs?: No Weakness of Legs: None Weakness  of Arms/Hands: None  Home Assistive Devices/Equipment Home Assistive Devices/Equipment: None  Therapy Consults (therapy consults require a physician order) PT Evaluation Needed: No OT Evalulation Needed: No SLP Evaluation Needed: No Abuse/Neglect Assessment (Assessment to be complete while patient is alone) Physical Abuse: Denies Verbal Abuse: Denies Sexual Abuse: Denies Exploitation of patient/patient's resources: Denies Self-Neglect: Denies Values / Beliefs Cultural Requests During Hospitalization: None Spiritual Requests During Hospitalization: None Consults Spiritual Care Consult Needed: No Social Work Consult Needed: No Merchant navy officer (For Healthcare) Advance Directive: Patient does not have advance directive Pre-existing out of facility DNR order (yellow form or pink MOST form): No    Additional Information 1:1 In Past 12 Months?: No CIRT Risk: No Elopement Risk: No Does patient have medical clearance?: Yes     Disposition:  Disposition Initial Assessment Completed for this Encounter: Yes Disposition of Patient: Inpatient treatment program Type of inpatient treatment program: Adult  On Site Evaluation by:   Reviewed with Physician:    Titus Mould, Eppie Gibson 09/22/2013 9:15 PM

## 2013-09-22 NOTE — ED Notes (Signed)
Patient is a vegetarian. 

## 2013-09-22 NOTE — BH Assessment (Signed)
[facility] Assessment Progress Note  Pt accepted to [facility] by Donell Sievert, PA to the service of Leata Mouse, MD, Rm [phone].  At 22:17 I notified Scherrie Merritts, LCSW, TS.  Doylene Canning, MA Triage Specialist 09/22/13 @ 22:20

## 2013-09-22 NOTE — ED Notes (Signed)
No acute distress noted upon discharge. 

## 2013-09-22 NOTE — Consult Note (Signed)
Pt accepted by Karleen Hampshire PA to Dr. Elsie Saas.  Pt is an active pt of Dr. Jannifer Franklin in outpatient.  Pt will go to room 508-1  Pt will need to be transported by [facility] by calling [phone]  Nurse to call report [phone]  Please send paper work to [facility] with pt - they must have the originals  Pt can't go until midnight to allow for their shift change
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
## 2013-09-22 NOTE — ED Provider Notes (Signed)
Medical screening examination/treatment/procedure(s) were performed by non-physician practitioner and as supervising physician I was immediately available for consultation/collaboration.  EKG Interpretation   None         Richardean Canal, MD 09/22/13 2310

## 2013-09-22 NOTE — ED Notes (Signed)
Mom took belongings home with her

## 2013-09-22 NOTE — ED Notes (Signed)
Tonia from poison control called and information given to her about patient labs.

## 2013-09-22 NOTE — ED Notes (Signed)
Patient currently denies SI HI AVH. Contracts for safety. Patient currently rates depression at 5/10. Patient states that she has had previous suicide attempts in the past. She currently denies anxiety and hopelessness. Patient resting quietly.

## 2013-09-22 NOTE — ED Notes (Signed)
Family has patient's belongings. 

## 2013-09-23 ENCOUNTER — Inpatient Hospital Stay (HOSPITAL_COMMUNITY)
Admission: AD | Admit: 2013-09-23 | Discharge: 2013-09-27 | DRG: 885 | Disposition: A | Discharge status: Home / Self Care | Payer: BC Managed Care – PPO | Source: Intra-hospital | Attending: Psychiatry | Admitting: Psychiatry

## 2013-09-23 ENCOUNTER — Encounter (HOSPITAL_COMMUNITY): Payer: Self-pay | Admitting: *Deleted

## 2013-09-23 DIAGNOSIS — F315 Bipolar disorder, current episode depressed, severe, with psychotic features: Secondary | ICD-10-CM

## 2013-09-23 DIAGNOSIS — F314 Bipolar disorder, current episode depressed, severe, without psychotic features: Principal | ICD-10-CM | POA: Diagnosis present

## 2013-09-23 DIAGNOSIS — Z79899 Other long term (current) drug therapy: Secondary | ICD-10-CM

## 2013-09-23 DIAGNOSIS — Q059 Spina bifida, unspecified: Secondary | ICD-10-CM

## 2013-09-23 DIAGNOSIS — R45851 Suicidal ideations: Secondary | ICD-10-CM

## 2013-09-23 MED ORDER — MAGNESIUM HYDROXIDE 400 MG/5ML PO SUSP: 30.0000 mL | Freq: Every day | ORAL | Status: DC | PRN

## 2013-09-23 MED ORDER — TRAZODONE HCL 50 MG PO TABS
50.0000 mg | ORAL_TABLET | Freq: Every evening | ORAL | Status: DC | PRN
  Administered 2013-09-24 – 2013-09-26 (×3): 50 mg via ORAL
  Filled 2013-09-23 (×12): qty 1

## 2013-09-23 MED ORDER — FLUOXETINE HCL 10 MG PO CAPS
10.0000 mg | ORAL_CAPSULE | Freq: Every day | ORAL | Status: DC
  Administered 2013-09-23 – 2013-09-27 (×5): 10 mg via ORAL
  Filled 2013-09-23 (×6): qty 1
  Filled 2013-09-23: qty 3
  Filled 2013-09-23: qty 1

## 2013-09-23 MED ORDER — FLUOXETINE HCL 20 MG PO TABS: 10.0000 mg | ORAL_TABLET | Freq: Every day | ORAL | Status: DC

## 2013-09-23 MED ORDER — ACETAMINOPHEN 325 MG PO TABS
650.0000 mg | ORAL_TABLET | Freq: Four times a day (QID) | ORAL | Status: DC | PRN
  Administered 2013-09-24 – 2013-09-25 (×2): 650 mg via ORAL
  Filled 2013-09-23 (×2): qty 2

## 2013-09-23 MED ORDER — ALUM & MAG HYDROXIDE-SIMETH 200-200-20 MG/5ML PO SUSP: 30.0000 mL | ORAL | Status: DC | PRN
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
## 2013-09-23 NOTE — H&P (Signed)
Psychiatric Admission Assessment Adult  Patient Identification:  Jacqueline Andrews Date of Evaluation:  09/23/2013 Chief Complaint:  MAJOR DEPRESSIVE DISORDER History of Present Illness:Jacqueline Andrews is a 22 year old woman brought to the [facility] on the advice of her psychotherapist at [facility] after she reported that she had taken an unknown amount of prescription tegratol in a suicide attempt.       [name] is followed for her bipolar disorder by Dr. Thedore Mins.  She relates that she has had increasing level of stress this year since January. 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 [name] states she has been on the deans list for the past 3 semesters but her social anxiety has kept her from attending classes for much of this semester. She reports that she has financial issues because she is not able to get disability.       She has a history of mania, but endorses poor appetite, increased fatigue, staying in bed all day, awake all night,  Crying spells for 3/7 days, with suicidal ideation. She is inconsistent in her story stating that her attempt was "spontaneous" but does feel that it was a cause of her consumption of alcohol this past weekend at [employer] homecoming. She was sober at the time she took the overdose. She reports that she has experienced aggressive behaviors while drinking in the past so she avoids alcohol.  Elements:  Location:  adult in patient . Quality:  chronic Severity:  moderate Timing:  3 days ago Duration:  Several months Context:  Poor performance at school Associated Signs/Synptoms: Depression Symptoms:  depressed mood, anhedonia, insomnia, psychomotor retardation, fatigue, feelings of worthlessness/guilt, difficulty concentrating, (Hypo) Manic Symptoms:  Impulsivity, Irritable Mood, Anxiety Symptoms:  Excessive Worry, Panic Symptoms, Social Anxiety, Psychotic Symptoms:  Hallucinations: Auditory PTSD  Symptoms: NA  Psychiatric Specialty Exam: Physical Exam  ROS  Blood pressure 118/85, pulse 89, temperature 98.3 F (36.8 C), temperature source Oral, resp. rate 18, height 5\' 4"  (1.626 m), weight 74.163 kg (163 lb 8 oz).Body mass index is 28.05 kg/(m^2).  General Appearance: Casual  Eye Contact::  Fair  Speech:  Clear and Coherent  Volume:  Normal  Mood:  Dysphoric  Affect:  Congruent  Thought Process:  Goal Directed  Orientation:  Full (Time, Place, and Person)  Thought Content:  WDL  Suicidal Thoughts:  Yes.  without intent/plan  Homicidal Thoughts:  No  Memory:  NA  Judgement:  Impaired  Insight:  Shallow  Psychomotor Activity:  Normal  Concentration:  Fair  Recall:  Fair  Akathisia:  No  Handed:  Right  AIMS (if indicated):     Assets:  Communication Skills Desire for Improvement Housing Physical Health Social Support  Sleep:  Number of Hours: 2    Past Psychiatric History: Diagnosis:  Hospitalizations:  Outpatient Care:  Substance Abuse Care:  Self-Mutilation:  Suicidal Attempts:  Violent Behaviors:   Past Medical History:   Past Medical History  Diagnosis Date  . Spina bifida   . Vaginal agenesis, total   . Congenital fusion of kidneys   . Depression   . Anxiety    Patient has vaginal agenisis. Allergies:   Allergies  Allergen Reactions  . Fentanyl Anaphylaxis  . Midazolam Hcl Anaphylaxis  . Antihistamines, Chlorpheniramine-Type Other (See Comments)    Hallucinate   . Oxycodone Hcl Other (See Comments)    Hallucinations   . Sulfa Antibiotics Nausea Only  PTA Medications: Prescriptions prior to admission  Medication Sig Dispense Refill  . carbamazepine (TEGRETOL XR) 200 MG 12 hr tablet Take 200 mg by mouth 2 (two) times daily.      Marland Kitchen FLUoxetine (PROZAC) 10 MG tablet Take 10 mg by mouth daily.        Previous Psychotropic Medications:  Medication/Dose                 Substance Abuse History in the last 12 months:   Alcohol on  homecoming weekend  Consequences of Substance Abuse: patient states she gets aggressive when she consums alcohol  Social History:  reports that she has never smoked. She has never used smokeless tobacco. She reports that she does not drink alcohol or use illicit drugs. Additional Social History:                      Current Place of Residence:   Place of Birth:   Family Members: Marital Status:  Single Children:  Sons:  Daughters: Relationships: Education:  Corporate treasurer Problems/Performance: Religious Beliefs/Practices: History of Abuse (Emotional/Phsycial/Sexual) Teacher, music History:  None. Legal History: Hobbies/Interests:  Family History:  History reviewed. No pertinent family history.  Results for orders placed during the hospital encounter of 09/22/13 (from the past 72 hour(s))  ACETAMINOPHEN LEVEL     Status: None   Collection Time    09/22/13  5:10 PM      Result Value Range   Acetaminophen (Tylenol), Serum <15.0  10 - 30 ug/mL   Comment:            THERAPEUTIC CONCENTRATIONS VARY     SIGNIFICANTLY. A RANGE OF 10-30     ug/mL MAY BE AN EFFECTIVE     CONCENTRATION FOR MANY PATIENTS.     HOWEVER, SOME ARE BEST TREATED     AT CONCENTRATIONS OUTSIDE THIS     RANGE.     ACETAMINOPHEN CONCENTRATIONS     >150 ug/mL AT 4 HOURS AFTER     INGESTION AND >50 ug/mL AT 12     HOURS AFTER INGESTION ARE     OFTEN ASSOCIATED WITH TOXIC     REACTIONS.  CBC     Status: None   Collection Time    09/22/13  5:10 PM      Result Value Range   WBC 10.1  4.0 - 10.5 K/uL   RBC 4.16  3.87 - 5.11 MIL/uL   Hemoglobin 13.0  12.0 - 15.0 g/dL   HCT 78.2  95.6 - 21.3 %   MCV 91.3  78.0 - 100.0 fL   MCH 31.3  26.0 - 34.0 pg   MCHC 34.2  30.0 - 36.0 g/dL   RDW 08.6  57.8 - 46.9 %   Platelets 348  150 - 400 K/uL  COMPREHENSIVE METABOLIC PANEL     Status: Abnormal   Collection Time    09/22/13  5:10 PM      Result Value Range   Sodium 132 (*)  135 - 145 mEq/L   Potassium 4.0  3.5 - 5.1 mEq/L   Chloride 98  96 - 112 mEq/L   CO2 24  19 - 32 mEq/L   Glucose, Bld 112 (*) 70 - 99 mg/dL   BUN 10  6 - 23 mg/dL   Creatinine, Ser 6.29  0.50 - 1.10 mg/dL   Calcium 9.6  8.4 - 52.8 mg/dL   Total Protein 8.3  6.0 - 8.3 g/dL  Albumin 3.8  3.5 - 5.2 g/dL   AST 24  0 - 37 U/L   ALT 21  0 - 35 U/L   Alkaline Phosphatase 88  39 - 117 U/L   Total Bilirubin 0.2 (*) 0.3 - 1.2 mg/dL   GFR calc non Af Amer >90  >90 mL/min   GFR calc Af Amer >90  >90 mL/min   Comment: (NOTE)     The eGFR has been calculated using the CKD EPI equation.     This calculation has not been validated in all clinical situations.     eGFR's persistently <90 mL/min signify possible Chronic Kidney     Disease.  ETHANOL     Status: None   Collection Time    09/22/13  5:10 PM      Result Value Range   Alcohol, Ethyl (B) <11  0 - 11 mg/dL   Comment:            LOWEST DETECTABLE LIMIT FOR     SERUM ALCOHOL IS 11 mg/dL     FOR MEDICAL PURPOSES ONLY  SALICYLATE LEVEL     Status: Abnormal   Collection Time    09/22/13  5:10 PM      Result Value Range   Salicylate Lvl <2.0 (*) 2.8 - 20.0 mg/dL  CARBAMAZEPINE LEVEL, TOTAL     Status: None   Collection Time    09/22/13  5:10 PM      Result Value Range   Carbamazepine Lvl 5.0  4.0 - 12.0 ug/mL   Comment: Performed at [facility]  URINE RAPID DRUG SCREEN (HOSP PERFORMED)     Status: None   Collection Time    09/22/13  5:16 PM      Result Value Range   Opiates NONE DETECTED  NONE DETECTED   Cocaine NONE DETECTED  NONE DETECTED   Benzodiazepines NONE DETECTED  NONE DETECTED   Amphetamines NONE DETECTED  NONE DETECTED   Tetrahydrocannabinol NONE DETECTED  NONE DETECTED   Barbiturates NONE DETECTED  NONE DETECTED   Comment:            DRUG SCREEN FOR MEDICAL PURPOSES     ONLY.  IF CONFIRMATION IS NEEDED     FOR ANY PURPOSE, NOTIFY LAB     WITHIN 5 DAYS.                LOWEST DETECTABLE LIMITS     FOR  URINE DRUG SCREEN     Drug Class       Cutoff (ng/mL)     Amphetamine      1000     Barbiturate      200     Benzodiazepine   200     Tricyclics       300     Opiates          300     Cocaine          300     THC              50  POCT PREGNANCY, URINE     Status: None   Collection Time    09/22/13  5:35 PM      Result Value Range   Preg Test, Ur NEGATIVE  NEGATIVE   Comment:            THE SENSITIVITY OF THIS     METHODOLOGY IS >24 mIU/mL   Psychological Evaluations:  Assessment:  DSM5:  Schizophrenia Disorders:   Obsessive-Compulsive Disorders:   Trauma-Stressor Disorders:   Substance/Addictive Disorders:   Depressive Disorders:  Bipolar disorder most recent episode depressed with psychotic features.  AXIS I:  Bipolar disorder recurrent most recent episode depressed with psychotic features. AXIS II:  Cluster B Traits AXIS III:   Past Medical History  Diagnosis Date  . Spina bifida   . Vaginal agenesis, total   . Congenital fusion of kidneys   . Depression   . Anxiety    AXIS IV:  educational problems AXIS V:  51-60 moderate symptoms  Treatment Plan/Recommendations:   1. Admit for crisis management and stabilization. 2. Medication management to reduce current symptoms to base line and improve the patient's overall level of functioning. 3. Treat health problems as indicated. 4. Develop treatment plan to decrease risk of relapse upon discharge and to reduce the need for readmission. 5. Psycho-social education regarding relapse prevention and self care. 6. Health care follow up as needed for medical problems. 7. Restart home medications where appropriate.   Treatment Plan Summary: Daily contact with patient to assess and evaluate symptoms and progress in treatment Medication management Current Medications:  Current Facility-Administered Medications  Medication Dose Route Frequency Provider Last Rate Last Dose  . acetaminophen (TYLENOL) tablet 650 mg  650 mg  Oral Q6H PRN Kerry Hough, PA-C      . alum & mag hydroxide-simeth (MAALOX/MYLANTA) 200-200-20 MG/5ML suspension 30 mL  30 mL Oral Q4H PRN Kerry Hough, PA-C      . magnesium hydroxide (MILK OF MAGNESIA) suspension 30 mL  30 mL Oral Daily PRN Kerry Hough, PA-C      . traZODone (DESYREL) tablet 50 mg  50 mg Oral QHS,MR X 1 Kerry Hough, PA-C        Observation Level/Precautions:  routine  Laboratory:  reviewed  Psychotherapy:  Individual and group  Medications:  Fluoxetine consider Lamictal  Consultations:  Dr. Jannifer Franklin  Discharge Concerns:  none  Estimated LOS:  3-5 days  Other:     I certify that inpatient services furnished can reasonably be expected to improve the patient's condition.   Rona Ravens. [place] RPAC 6:14 PM 09/23/2013  Patient was seen face-to-face for this psychiatric evaluation, suicide risk assessment and made treatment plan andReviewed the information documented and agree with the treatment plan.  Senan Urey,JANARDHAHA R. 09/24/2013 7:34 PM

## 2013-09-23 NOTE — [facility] Suicide Risk Assessment (Signed)
[facility] INPATIENT:  Family/Significant Other Suicide Prevention Education  Suicide Prevention Education:  Education Completed; Jacqueline Andrews, Mother, [phone]; has been identified by the patient as the family member/significant other with whom the patient will be residing, and identified as the person(s) who will aid the patient in the event of a mental health crisis (suicidal ideations/suicide attempt).  With written consent from the patient, the family member/significant other has been provided the following suicide prevention education, prior to the and/or following the discharge of the patient.  The suicide prevention education provided includes the following:  Suicide risk factors  Suicide prevention and interventions  National Suicide Hotline telephone number  [facility] assessment telephone number  [facility] Emergency Assistance 911  [facility] and/or Residential Mobile Crisis Unit telephone number  Request made of family/significant other to:  Remove weapons (e.g., guns, rifles, knives), all items previously/currently identified as safety concern.  Mother advised patient does not have access to guns.  Remove drugs/medications (over-the-counter, prescriptions, illicit drugs), all items previously/currently identified as a safety concern.  The family member/significant other verbalizes understanding of the suicide prevention education information provided.  The family member/significant other agrees to remove the items of safety concern listed above.  Jacqueline Andrews 09/23/2013, 3:28 PM

## 2013-09-23 NOTE — [facility] Suicide Risk Assessment (Signed)
Suicide Risk Assessment  Admission Assessment     Nursing information obtained from:  Patient Demographic factors:  Adolescent or young adult;Living alone Current Mental Status:  Suicidal ideation indicated by patient Loss Factors:  Financial problems / change in socioeconomic status Historical Factors:  Prior suicide attempts;Family history of mental illness or substance abuse Risk Reduction Factors:  Sense of responsibility to family;Positive therapeutic relationship  CLINICAL FACTORS:   Bipolar Disorder:   Depressive phase Depression:   Aggression Anhedonia Hopelessness Impulsivity Insomnia Recent sense of peace/wellbeing Severe Unstable or Poor Therapeutic Relationship Previous Psychiatric Diagnoses and Treatments Medical Diagnoses and Treatments/Surgeries  COGNITIVE FEATURES THAT CONTRIBUTE TO RISK:  Closed-mindedness Loss of executive function Polarized thinking Thought constriction (tunnel vision)    SUICIDE RISK:   Moderate:  Frequent suicidal ideation with limited intensity, and duration, some specificity in terms of plans, no associated intent, good self-control, limited dysphoria/symptomatology, some risk factors present, and identifiable protective factors, including available and accessible social support.  PLAN OF CARE: Admitted voluntarily, emergently from [facility] after she was referred for depression, suicidal overdose on her Tegretol after verbal altercation.    I certify that inpatient services furnished can reasonably be expected to improve the patient's condition.  Jacqueline Andrews,Jacqueline R. 09/23/2013, 1:11 PM

## 2013-09-23 NOTE — Progress Notes (Signed)
D:  Patient's self inventory sheet, patient has fair sleep, good appetite, normal energy level, good attention span.  Rated depression 4, hopeless 1.  Denied withdrawals.  Denied SI.  Has experienced headache in past 24 hours.   Worst pain 1.  After discharge, will see therapist regularly, take meds regularly.  Does not handle stress very well.  Would like her dogs to visit her, make her want to live, misses her dogs.   No discharge plans.  Does have problems taking meds after discharge.  Sometimes forgets to take her medications. A:  Medications administered per MD orders.  Emotional support and encouragement given patient. R":  Denied SI and HI.  Contracts for safety.  Denied A/V hallucinations.  Denied pain.  Will continue to monitor patient for safety with 15 minute checks.  Safety maintained.

## 2013-09-23 NOTE — Progress Notes (Signed)
The focus of this group is to educate the patient on the purpose and policies of crisis stabilization and provide a format to answer questions about their admission.  The group details unit policies and expectations of patients while admitted.  Patient was in bed and did not attend 0900 nurse education orientation group. \ 

## 2013-09-23 NOTE — Tx Team (Signed)
Initial Interdisciplinary Treatment Plan  PATIENT STRENGTHS: (choose at least two) Average or above average intelligence Capable of independent living Communication skills General fund of knowledge Supportive family/friends  PATIENT STRESSORS: Educational concerns Financial difficulties Health problems   PROBLEM LIST: Problem List/Patient Goals Date to be addressed Date deferred Reason deferred Estimated date of resolution  Suicidal Ideations 09-23-13     Depression 09-23-13                                                DISCHARGE CRITERIA:  Improved stabilization in mood, thinking, and/or behavior Need for constant or close observation no longer present Reduction of life-threatening or endangering symptoms to within safe limits  PRELIMINARY DISCHARGE PLAN: Attend aftercare/continuing care group Attend PHP/IOP Outpatient therapy Return to previous living arrangement Return to previous work or school arrangements  PATIENT/FAMIILY INVOLVEMENT: This treatment plan has been presented to and reviewed with the patient, Jacqueline Andrews, and/or family member.  The patient and family have been given the opportunity to ask questions and make suggestions.  Jacqueline Andrews 09/23/2013, 1:40 AM

## 2013-09-23 NOTE — Progress Notes (Signed)
Adult Psychoeducational Group Note  Date:  09/23/2013 Time:  11:00AM Group Topic/Focus:  Leisure and Lifestyle Changes  Participation Level:  Did Not Attend    Additional Comments:  Pt. Didn't attend group.  Bing Plume D 09/23/2013, 4:23 PM

## 2013-09-23 NOTE — [facility] Group Notes (Signed)
[facility] LCSW Group Therapy  [facility] [employer] [facility] [place] 1:15 - 2:30 PM  09/23/2013 2:51 PM   Type of Therapy:  Group Therapy  Participation Level:  Minimal  Participation Quality:  Attentive  Affect:  Appropriate  Cognitive:  Appropriate  Insight:  Developing/Improving  Engagement in Therapy:  Developing/Improving  Modes of Intervention:  Discussion, Education, Exploration, Problem-Solving, Rapport Building, Support   Summary of Progress/Problems:  Patient listened attentively to speaker from [employer]. Patient made no comments on the presentation.  [name] 09/23/2013 2:51 PM

## 2013-09-23 NOTE — Progress Notes (Signed)
Nutrition Brief Note  Patient identified on the Malnutrition Screening Tool (MST) Report.  Wt Readings from Last 10 Encounters:  09/23/13 163 lb 8 oz (74.163 kg)  07/16/12 154 lb (69.854 kg)  07/13/12 154 lb (69.854 kg)   Body mass index is 28.05 kg/(m^2). Patient meets criteria for overweight based on current BMI.   Discussed intake PTA with patient and compared to intake presently.  Discussed changes in intake, if any, and encouraged adequate intake of meals and snacks.   Met with pt who reports being a vegan. Frustrated about cafeteria options, wants salad, encouraged pt to contact staff about this request. Per RN, salads typically available at mealtimes. 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 Eats only 1 full meal/day plus snacks. Agreeable to trying Ensure Complete.   Current diet order is regular and pt is also offered choice of unit snacks mid-morning and mid-afternoon.  Pt is eating as desired.   Labs and medications reviewed.   Nutrition Dx:  Unintended wt change r/t suboptimal oral intake AEB pt report  Interventions:   Discussed the importance of nutrition and encouraged intake of food and beverages.     Supplements: Ensure Complete BID    No additional nutrition interventions warranted at this time. If nutrition issues arise, please consult RD.   Levon Hedger MS, RD, LDN [phone] Pager [phone] After Hours Pager

## 2013-09-24 MED ORDER — LAMOTRIGINE 25 MG PO TABS
25.0000 mg | ORAL_TABLET | Freq: Every day | ORAL | Status: DC
  Administered 2013-09-24 – 2013-09-26 (×3): 25 mg via ORAL
  Filled 2013-09-24 (×5): qty 1
  Filled 2013-09-24: qty 3

## 2013-09-24 NOTE — Progress Notes (Signed)
[facility] MD Progress Note  09/24/2013 1:37 PM Jacqueline Andrews  MRN:  [id number]  Subjective:  Patient states she is all right today and attempted to minimize her symptoms. Did discuss her inconsistency with history. She states she was too anxious around groups of people to attend classes but was out partying at [employer] with large groups of people the weekend prior to her admission. She notes that she was self medicating with alcohol all weekend.  Assessment:   DSM5:  Schizophrenia Disorders:  Obsessive-Compulsive Disorders:  Trauma-Stressor Disorders:  Substance/Addictive Disorders:  Depressive Disorders: Bipolar disorder most recent episode depressed with psychotic features.  AXIS I: Bipolar disorder recurrent most recent episode depressed with psychotic features.  AXIS II: Cluster B Traits  AXIS III:  Past Medical History   Diagnosis  Date   .  Spina bifida    .  Vaginal agenesis, total    .  Congenital fusion of kidneys    .  Depression    .  Anxiety     AXIS IV: educational problems  AXIS V: 51-60 moderate symptoms       ADL's:  Intact  Sleep: Fair  Appetite:  Fair  Suicidal Ideation:  +ideation but no plan and no intent Homicidal Ideation:  denies AEB (as evidenced by):  Psychiatric Specialty Exam: Review of Systems  Constitutional: Negative.  Negative for fever, chills, weight loss, malaise/fatigue and diaphoresis.  HENT: Negative for congestion and sore throat.   Eyes: Negative for blurred vision, double vision and photophobia.  Respiratory: Negative for cough, shortness of breath and wheezing.   Cardiovascular: Negative for chest pain, palpitations and PND.  Gastrointestinal: Negative for heartburn, nausea, vomiting, abdominal pain, diarrhea and constipation.  Musculoskeletal: Negative for falls, joint pain and myalgias.  Neurological: Negative for dizziness, tingling, tremors, sensory change, speech change, focal weakness, seizures, loss of  consciousness, weakness and headaches.  Endo/Heme/Allergies: Negative for polydipsia. Does not bruise/bleed easily.  Psychiatric/Behavioral: Negative for depression, suicidal ideas, hallucinations, memory loss and substance abuse. The patient is not nervous/anxious and does not have insomnia.     Blood pressure 115/83, pulse 89, temperature 98.3 F (36.8 C), temperature source Oral, resp. rate 18, height 5\' 4"  (1.626 m), weight 74.163 kg (163 lb 8 oz).Body mass index is 28.05 kg/(m^2).  General Appearance: Disheveled  Eye Solicitor::  Fair  Speech:  Clear and Coherent  Volume:  Normal  Mood:  Anxious and Depressed  Affect:  Congruent  Thought Process:  Goal Directed  Orientation:  Full (Time, Place, and Person)  Thought Content:  WDL  Suicidal Thoughts:  Yes.  without intent/plan  Homicidal Thoughts:  No  Memory:  Immediate;   Poor  Judgement:  Poor  Insight:  Lacking  Psychomotor Activity:  Normal  Concentration:  Fair  Recall:  Poor  Akathisia:  No  Handed:  Right  AIMS (if indicated):     Assets:  Communication Skills Desire for Improvement  Sleep:  Number of Hours: 6   Current Medications: Current Facility-Administered Medications  Medication Dose Route Frequency Provider Last Rate Last Dose  . acetaminophen (TYLENOL) tablet 650 mg  650 mg Oral Q6H PRN Kerry Hough, PA-C   650 mg at 09/24/13 1111  . alum & mag hydroxide-simeth (MAALOX/MYLANTA) 200-200-20 MG/5ML suspension 30 mL  30 mL Oral Q4H PRN Kerry Hough, PA-C      . FLUoxetine (PROZAC) capsule 10 mg  10 mg Oral Daily Nehemiah Settle, MD   10 mg at  09/24/13 4540  . magnesium hydroxide (MILK OF MAGNESIA) suspension 30 mL  30 mL Oral Daily PRN Kerry Hough, PA-C      . traZODone (DESYREL) tablet 50 mg  50 mg Oral QHS,MR X 1 Kerry Hough, PA-C        Lab Results:  Results for orders placed during the hospital encounter of 09/22/13 (from the past 48 hour(s))  ACETAMINOPHEN LEVEL     Status: None    Collection Time    09/22/13  5:10 PM      Result Value Range   Acetaminophen (Tylenol), Serum <15.0  10 - 30 ug/mL   Comment:            THERAPEUTIC CONCENTRATIONS VARY     SIGNIFICANTLY. A RANGE OF 10-30     ug/mL MAY BE AN EFFECTIVE     CONCENTRATION FOR MANY PATIENTS.     HOWEVER, SOME ARE BEST TREATED     AT CONCENTRATIONS OUTSIDE THIS     RANGE.     ACETAMINOPHEN CONCENTRATIONS     >150 ug/mL AT 4 HOURS AFTER     INGESTION AND >50 ug/mL AT 12     HOURS AFTER INGESTION ARE     OFTEN ASSOCIATED WITH TOXIC     REACTIONS.  CBC     Status: None   Collection Time    09/22/13  5:10 PM      Result Value Range   WBC 10.1  4.0 - 10.5 K/uL   RBC 4.16  3.87 - 5.11 MIL/uL   Hemoglobin 13.0  12.0 - 15.0 g/dL   HCT 98.1  19.1 - 47.8 %   MCV 91.3  78.0 - 100.0 fL   MCH 31.3  26.0 - 34.0 pg   MCHC 34.2  30.0 - 36.0 g/dL   RDW 29.5  62.1 - 30.8 %   Platelets 348  150 - 400 K/uL  COMPREHENSIVE METABOLIC PANEL     Status: Abnormal   Collection Time    09/22/13  5:10 PM      Result Value Range   Sodium 132 (*) 135 - 145 mEq/L   Potassium 4.0  3.5 - 5.1 mEq/L   Chloride 98  96 - 112 mEq/L   CO2 24  19 - 32 mEq/L   Glucose, Bld 112 (*) 70 - 99 mg/dL   BUN 10  6 - 23 mg/dL   Creatinine, Ser 6.57  0.50 - 1.10 mg/dL   Calcium 9.6  8.4 - 84.6 mg/dL   Total Protein 8.3  6.0 - 8.3 g/dL   Albumin 3.8  3.5 - 5.2 g/dL   AST 24  0 - 37 U/L   ALT 21  0 - 35 U/L   Alkaline Phosphatase 88  39 - 117 U/L   Total Bilirubin 0.2 (*) 0.3 - 1.2 mg/dL   GFR calc non Af Amer >90  >90 mL/min   GFR calc Af Amer >90  >90 mL/min   Comment: (NOTE)     The eGFR has been calculated using the CKD EPI equation.     This calculation has not been validated in all clinical situations.     eGFR's persistently <90 mL/min signify possible Chronic Kidney     Disease.  ETHANOL     Status: None   Collection Time    09/22/13  5:10 PM      Result Value Range   Alcohol, Ethyl (B) <11  0 - 11 mg/dL   Comment:  LOWEST DETECTABLE LIMIT FOR     SERUM ALCOHOL IS 11 mg/dL     FOR MEDICAL PURPOSES ONLY  SALICYLATE LEVEL     Status: Abnormal   Collection Time    09/22/13  5:10 PM      Result Value Range   Salicylate Lvl <2.0 (*) 2.8 - 20.0 mg/dL  CARBAMAZEPINE LEVEL, TOTAL     Status: None   Collection Time    09/22/13  5:10 PM      Result Value Range   Carbamazepine Lvl 5.0  4.0 - 12.0 ug/mL   Comment: Performed at [facility]  URINE RAPID DRUG SCREEN (HOSP PERFORMED)     Status: None   Collection Time    09/22/13  5:16 PM      Result Value Range   Opiates NONE DETECTED  NONE DETECTED   Cocaine NONE DETECTED  NONE DETECTED   Benzodiazepines NONE DETECTED  NONE DETECTED   Amphetamines NONE DETECTED  NONE DETECTED   Tetrahydrocannabinol NONE DETECTED  NONE DETECTED   Barbiturates NONE DETECTED  NONE DETECTED   Comment:            DRUG SCREEN FOR MEDICAL PURPOSES     ONLY.  IF CONFIRMATION IS NEEDED     FOR ANY PURPOSE, NOTIFY LAB     WITHIN 5 DAYS.                LOWEST DETECTABLE LIMITS     FOR URINE DRUG SCREEN     Drug Class       Cutoff (ng/mL)     Amphetamine      1000     Barbiturate      200     Benzodiazepine   200     Tricyclics       300     Opiates          300     Cocaine          300     THC              50  POCT PREGNANCY, URINE     Status: None   Collection Time    09/22/13  5:35 PM      Result Value Range   Preg Test, Ur NEGATIVE  NEGATIVE   Comment:            THE SENSITIVITY OF THIS     METHODOLOGY IS >24 mIU/mL    Physical Findings: AIMS: Facial and Oral Movements Muscles of Facial Expression: None, normal Lips and Perioral Area: None, normal Jaw: None, normal Tongue: None, normal,Extremity Movements Upper (arms, wrists, hands, fingers): None, normal Lower (legs, knees, ankles, toes): None, normal, Trunk Movements Neck, shoulders, hips: None, normal, Overall Severity Severity of abnormal movements (highest score from questions above): None,  normal Incapacitation due to abnormal movements: None, normal Patient's awareness of abnormal movements (rate only patient's report): No Awareness, Dental Status Current problems with teeth and/or dentures?: No Does patient usually wear dentures?: No  CIWA:  CIWA-Ar Total: 1 COWS:  COWS Total Score: 2  Treatment Plan Summary: Daily contact with patient to assess and evaluate symptoms and progress in treatment Medication management  Plan: 1. Continue crisis management and stabilization. 2. Medication management to reduce current symptoms to base line and improve patient's overall level of functioning 3. Treat health problems as indicated. 4. Develop treatment plan to decrease risk of relapse upon discharge and the need for  readmission. 5. Psycho-social education regarding relapse prevention and self care. 6. Health care follow up as needed for medical problems. 7. Continue home medications where appropriate. 8. Elos: Rev. Monday for further admission. 9. Will initiate Lamictal for mood stabilization.  Medical Decision Making Problem Points:  Established problem, stable/improving (1) Data Points:  Review or order medicine tests (1)  I certify that inpatient services furnished can reasonably be expected to improve the patient's condition.  Rona Ravens. Mashburn RPAC 4:24 PM 09/24/2013  Reviewed the information documented and agree with the treatment plan.  Serenna Deroy,JANARDHAHA R. 09/24/2013 7:32 PM

## 2013-09-24 NOTE — Progress Notes (Signed)
D: Pt presents with a mild amount of anxiety this evening. Pt reports that large crowds heightens her anxiety. She is reporting to be adjusting well to the unit. She is denying any SI at this time. Pt attended group karaoke this evening.  A: Continued support and availability as needed was extended to this pt. Staff continue to monitor pt with q14min checks.  R: Pt receptive to treatment. Pt remains safe at this time.

## 2013-09-24 NOTE — Progress Notes (Signed)
Adult Psychoeducational Group Note  Date:  09/24/2013 Time:  10:00am Group Topic/Focus:  Therapeutic Activity  Participation Level:  Did Not Attend  Participation Quality:    Affect:    Cognitive:   Insight:   Engagement in Group:    Modes of Intervention:    Additional Comments:  Pt did not attend group  Shelly Bombard D 09/24/2013, 11:34 AM

## 2013-09-24 NOTE — Progress Notes (Signed)
Adult Psychoeducational Group Note  Date:  09/24/2013 Time:  8:00pm Group Topic/Focus:  Wrap-Up Group:   The focus of this group is to help patients review their daily goal of treatment and discuss progress on daily workbooks.  Participation Level:  Did Not Attend  Participation Quality:    Affect:    Cognitive:    Insight:   Engagement in Group:    Modes of Intervention:   Additional Comments:  Pt did not attend group  Shelly Bombard D 09/24/2013, 10:12 PM

## 2013-09-24 NOTE — [facility] Group Notes (Signed)
[facility] LCSW Group Therapy  Feelings Around Relapse 1:15 -2:30           09/24/2013  4:15 PM   Type of Therapy:  Group Therapy  Participation Level:  Appropriate  Participation Quality:  Appropriate  Affect:  Appropriate  Cognitive:  Attentive Appropriate  Insight:  Developing/Improving  Engagement in Therapy: Developing/Improving  Modes of Intervention:  Discussion Exploration Problem-Solving Supportive  Summary of Progress/Problems:  The topic for today was feelings around relapse.    Patient processed feelings toward relapse and was able to relate to peers. She advised she is unable to sleep or eat when she is relapsing into depression.  Patient identified coping skills that can be used to prevent a relapse.   [name] 09/24/2013  4:15 PM

## 2013-09-24 NOTE — Progress Notes (Signed)
Patient ID: Jacqueline Andrews, female   DOB: [birth year], 22 y.o.   MRN: [id number]  D: Patient withdrawn and secluded in her room. Pt with no peer interaction and is guarded. A: Q 15 minute safety checks, encourage group participation and peer interaction. Administer medications as ordered by MD. R: Pt remains depressed and guarded. Pt denies SI.

## 2013-09-24 NOTE — Progress Notes (Signed)
D) Pt. Has been attending the groups and interacting appropriately with select peers. Rates her depression at a 6 and her hopelessness at a 1. Denies SI and HI. Pt is very soft spoken and at time eye contact is poor. States because she is Vegan she does not want people to watch her eat. A) Given support and reassurance along with appropriate praise. Encouraged to go to the cafeteria.  R) Denies SI and HI. States that she wants to leave here.

## 2013-09-24 NOTE — Tx Team (Signed)
Interdisciplinary Treatment Plan Update   Date Reviewed:  09/24/2013  Time Reviewed:  9:42 AM  Progress in Treatment:   Attending groups: Yes Participating in groups: Yes Taking medication as prescribed: Yes  Tolerating medication: Yes Family/Significant other contact made: Yes, contact made with mothr. Patient understands diagnosis: Yes  Discussing patient identified problems/goals with staff: Yes Medical problems stabilized or resolved: Yes Denies suicidal/homicidal ideation: Yes Patient has not harmed self or others: Yes  For review of initial/current patient goals, please see plan of care.  Estimated Length of Stay:  3-4 days  Reasons for Continued Hospitalization:  Anxiety Depression Medication stabilization  New Problems/Goals identified:    Discharge Plan or Barriers:   Home with outpatient follow up with Dr. Jannifer Franklin  Additional Comments:  Jacqueline Andrews is a 22 year old woman brought to the [facility] on the advice of her psychotherapist at [facility] after she reported that she had taken an unknown amount of prescription tegratol in a suicide attempt.    Attendees:  Patient:  09/24/2013 9:42 AM   Signature: Mervyn Gay, MD 09/24/2013 9:42 AM  Signature:  Verne Spurr, PA 09/24/2013 9:42 AM  Signature: Lamount Cranker, RN 09/24/2013 9:42 AM  Signature: 09/24/2013 9:42 AM  Signature:   09/24/2013 9:42 AM  Signature:  Juline Patch, LCSW 09/24/2013 9:42 AM  Signature:   09/24/2013 9:42 AM  Signature:  09/24/2013 9:42 AM  Signature:   09/24/2013 9:42 AM  Signature:  09/24/2013  9:42 AM  Signature:   09/24/2013  9:42 AM  Signature:   09/24/2013  9:42 AM    Scribe for Treatment Team:   Juline Patch,  09/24/2013 9:42 AM

## 2013-09-25 DIAGNOSIS — F314 Bipolar disorder, current episode depressed, severe, without psychotic features: Principal | ICD-10-CM | POA: Diagnosis present

## 2013-09-25 NOTE — [facility] Group Notes (Signed)
[facility] Group Notes:  (Clinical Social Work)  09/25/2013   3:00-4:00PM  Summary of Progress/Problems:   The main focus of today's process group was for the patient to identify ways in which they have sabotaged their own mental health wellness/recovery.  Motivational interviewing was used to explore the reasons they engage in this behavior, and reasons they may have for wanting to change.  The Stages of Change were explained to the group using a handout, and patients identified where they are with regard to changing self-defeating behaviors.  The patient expressed that she self sabotages by isolating herself and sulking for days, to the point that she has missed 4-5 weeks of school every year.  She does this to be alone and not have to deal with other people who don't understand, but acknowledges that this leads to her mind racing and her making irrational or bad choices.  Type of Therapy:  Process Group  Participation Level:  Active  Participation Quality:  Appropriate, Attentive and Sharing  Affect:  Blunted  Cognitive:  Appropriate  Insight:  Engaged  Engagement in Therapy:  Engaged  Modes of Intervention:  Education, Motivational Interviewing   Ambrose Mantle, LCSW 09/25/2013, 1:58 PM

## 2013-09-25 NOTE — Progress Notes (Signed)
[facility] Group Notes:  (Nursing/MHT/Case Management/Adjunct)  Date:  09/25/2013  Time:  2:33 PM  Type of Therapy:  Psychoeducational Skills  Participation Level:  Active  Participation Quality:  Appropriate  Affect:  Appropriate  Cognitive:  Appropriate  Insight:  Appropriate  Engagement in Group:  Engaged  Modes of Intervention:  Activity  Summary of Progress/Problems:  Jacqueline Andrews C 09/25/2013, 2:33 PM 

## 2013-09-25 NOTE — Progress Notes (Signed)
.  Psychoeducational Group Note    Date: 09/25/2013 Time:  0930  Goal Setting Purpose of Group: To be able to set a goal that is measurable and that can be accomplished in one day  Participation Level:  Active  Participation Quality:  Appropriate  Affect:  Appropriate  Cognitive:  Oriented  Insight:  Improving  Engagement in Group:  Improving  Additional Comments:    Siara Gorder A  

## 2013-09-25 NOTE — Progress Notes (Signed)
Adult Psychoeducational Group Note  Date:  09/25/2013 Time:  8:00pm Group Topic/Focus:  Wrap-Up Group:   The focus of this group is to help patients review their daily goal of treatment and discuss progress on daily workbooks.  Participation Level:  Did Not Attend  Participation Quality:    Affect:    Cognitive:    Insight:   Engagement in Group:    Modes of Intervention:    Additional Comments:  Pt did not attend group  Shelly Bombard D 09/25/2013, 9:44 PM

## 2013-09-25 NOTE — Progress Notes (Signed)
D) Pt upset today. 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 Rates her deprssion and hopelessness both at a 1 and denies SI and HI. Woke up this morning with a headache and was given 650 mg of tylenol at 0755. Pt talked about her boyfriend and their relationship and how she feels that her boyfriend is so fearful that she will leave him. States she feels his needs are greater that she can deal with sometimes. Rates her depression and hopelessness both at a 1 and denies SI and HI. A) Talked with the cafeteria and have arranged for Pt to get the food she needs. Also allowed her mother to bring up some Burton Apley which is protein for her. Given support, reassurance and praise. R) Denies SI and HI. Responding well to all that she is learning on the unit.

## 2013-09-25 NOTE — Progress Notes (Signed)
[facility] MD Progress Note  09/25/2013 1:07 PM Jacqueline Andrews  MRN:  [id number] Subjective:  Jacqueline Andrews is a 22 year old female brought to the [facility] on the advice of her psychotherapist Dr. Jannifer Franklin after she reported that she had taken an unknown amount of prescription tegratol in a suicide attempt. She states that she has increased levels of stress recently due to school and financial concerns. 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 Patient states "I don't know how I'm going to be a doctor since I cannot deal with stress." Patient states that she has had manic and depressive episodes "a lot this year." States that when she had to communicate with her dad recently about money also triggered her stress.   At present, patient states "I am not happy with this hospital." States that she has been vegan for a year and that the cafeteria has not been providing meals that allow her to adhere to her vegan diet. Patient states that "she didn't sleep that great."  Diagnosis:   DSM5: Depressive Disorders: Bipolar disorder most recent episode depressed with psychotic features.  AXIS I: Bipolar disorder recurrent most recent episode depressed with psychotic features.  AXIS II: Cluster B Traits    ADL's:  Intact  Sleep: Fair  Appetite:  Good  Suicidal Ideation:  No plan - ideation  Homicidal Ideation:  Plan:  None Intent:  None Means:  None AEB (as evidenced by):  Psychiatric Specialty Exam: Review of Systems  Constitutional: Negative.   HENT: Negative.   Respiratory: Negative.   Musculoskeletal: Negative.   Skin: Negative.   Psychiatric/Behavioral: Positive for depression.    Blood pressure 123/82, pulse 109, temperature 98 F (36.7 C), temperature source Oral, resp. rate 16, height 5\' 4"  (1.626 m), weight 73.936 kg (163 lb).Body mass index is 27.97 kg/(m^2).  General Appearance: Neat  Eye Contact::  Good  Speech:  Clear and  Coherent  Volume:  Normal  Mood:  Depressed and Euthymic  Affect:  Congruent  Thought Process:  Coherent  Orientation:  Full (Time, Place, and Person)  Thought Content:  WDL  Suicidal Thoughts:  No  Homicidal Thoughts:  No  Memory:  Immediate;   Good Recent;   Good Remote;   Good  Judgement:  Fair  Insight:  Good  Psychomotor Activity:  Normal  Concentration:  Good  Recall:  Good  Akathisia:  No  Handed:  Right  AIMS (if indicated):     Assets:  Desire for Improvement  Sleep:  Number of Hours: 5.5   Current Medications: Current Facility-Administered Medications  Medication Dose Route Frequency Provider Last Rate Last Dose  . acetaminophen (TYLENOL) tablet 650 mg  650 mg Oral Q6H PRN Kerry Hough, PA-C   650 mg at 09/25/13 0755  . alum & mag hydroxide-simeth (MAALOX/MYLANTA) 200-200-20 MG/5ML suspension 30 mL  30 mL Oral Q4H PRN Kerry Hough, PA-C      . FLUoxetine (PROZAC) capsule 10 mg  10 mg Oral Daily Nehemiah Settle, MD   10 mg at 09/25/13 0754  . lamoTRIgine (LAMICTAL) tablet 25 mg  25 mg Oral QHS Verne Spurr, PA-C   25 mg at 09/24/13 2130  . magnesium hydroxide (MILK OF MAGNESIA) suspension 30 mL  30 mL Oral Daily PRN Kerry Hough, PA-C      . traZODone (DESYREL) tablet 50 mg  50 mg Oral QHS,MR X 1 Kerry Hough, PA-C  50 mg at 09/24/13 2130    Lab Results: No results found for this or any previous visit (from the past 48 hour(s)).  Physical Findings: AIMS: Facial and Oral Movements Muscles of Facial Expression: None, normal Lips and Perioral Area: None, normal Jaw: None, normal Tongue: None, normal,Extremity Movements Upper (arms, wrists, hands, fingers): None, normal Lower (legs, knees, ankles, toes): None, normal, Trunk Movements Neck, shoulders, hips: None, normal, Overall Severity Severity of abnormal movements (highest score from questions above): None, normal Incapacitation due to abnormal movements: None, normal Patient's awareness  of abnormal movements (rate only patient's report): No Awareness, Dental Status Current problems with teeth and/or dentures?: No Does patient usually wear dentures?: No  CIWA:  CIWA-Ar Total: 1 COWS:  COWS Total Score: 2  Treatment Plan Summary: Daily contact with patient to assess and evaluate symptoms and progress in treatment Medication management  Plan: Review of chart, vital signs, medications and notes 1. Individual and group therapy 2. Medication management for bipolar disorder; patient feels Prozac dose needs to be increased 3. Coping skills for stress management and suicidal ideation 4. Continue crisis stabilization /management 5. Address health issues as needed; vital signs stable, will continue to monitor 6.Treatment plan in progress to prevent manic and depressive episodes   Medical Decision Making Problem Points:  Established problem, stable/improving (1) Data Points:  Review of medication regiment & side effects (2)  I certify that inpatient services furnished can reasonably be expected to improve the patient's condition.   Alberteen Sam, FNP-BC 09/25/2013, 1:07 PM

## 2013-09-25 NOTE — Progress Notes (Signed)
Psychoeducational Group Note  Date: 09/25/2013 Time:  1015  Group Topic/Focus:  Identifying Needs:   The focus of this group is to help patients identify their personal needs that have been historically problematic and identify healthy behaviors to address their needs.  Participation Level:  Active  Participation Quality:  Attentive  Affect:  Appropriate  Cognitive:  Oriented  Insight:  Improving  Engagement in Group:  Engaged  Additional Comments:    Rishikesh Khachatryan A 

## 2013-09-26 DIAGNOSIS — F3130 Bipolar disorder, current episode depressed, mild or moderate severity, unspecified: Secondary | ICD-10-CM

## 2013-09-26 DIAGNOSIS — F411 Generalized anxiety disorder: Secondary | ICD-10-CM

## 2013-09-26 NOTE — Progress Notes (Signed)
Adult Psychoeducational Group Note  Date:  09/26/2013 Time:  8:00pm Group Topic/Focus:  Wrap-Up Group:   The focus of this group is to help patients review their daily goal of treatment and discuss progress on daily workbooks.  Participation Level:  Active  Participation Quality:  Appropriate and Attentive  Affect:  Appropriate  Cognitive:  Alert and Appropriate  Insight: Appropriate  Engagement in Group:  Engaged  Modes of Intervention:  Discussion and Education  Additional Comments:   Pt attended and participated in group. Wrap up group discussion talked about 15 minute checks,enviromentals, and falls precautions. The question was asked How was your day? Pt stated her day was good. Pt stated her boyfriend and brother came to visit.  Shelly Bombard D 09/26/2013, 9:45 PM

## 2013-09-26 NOTE — Progress Notes (Signed)
Patient has been lying in bed asleep and did not attend group this evening. Writer awakened her and she  voiced no complaints, reported that she has not slept and is tired. Writer allowed her to continue to rest. Patient currently denies having pain, -si/hi/a/v hall. Support and encouragement offered, safety maintained on unit with 15 min checks, will continue to monitor.

## 2013-09-26 NOTE — Progress Notes (Signed)
D) Pt has been attending the groups and working on her packets throughout the day. Affect and mood are appropriate. Pt states that she has learned a lot and wants very much to go out and use her new skills. Denies SI and HI. A) Given support, reassurance and praise. Encouraged to continueto work on her workbooks R) Denies SI and HI.

## 2013-09-26 NOTE — Progress Notes (Signed)
[facility] MD Progress Note  09/26/2013 1:26 PM Jacqueline Andrews  MRN:  [id number] Subjective:  Patient states she has no depression today, no suicidal ideations or hallucinations, medications are doing well.  She is pleasant with euthymic mood and affect, her boyfriend will live with her to help her and she will restart in January. Diagnosis:   DSM5:  Axis I: Anxiety Disorder NOS and Bipolar, Depressed Axis II: Deferred Axis III:  Past Medical History  Diagnosis Date  . Spina bifida   . Vaginal agenesis, total   . Congenital fusion of kidneys   . Depression   . Anxiety    Axis IV: other psychosocial or environmental problems, problems related to social environment and problems with primary support group Axis V: 41-50 serious symptoms  ADL's:  Intact  Sleep: Good  Appetite:  Good  Suicidal Ideation:  Denies Homicidal Ideation:  Denies  Psychiatric Specialty Exam: Review of Systems  Constitutional: Negative.   HENT: Negative.   Eyes: Negative.   Respiratory: Negative.   Cardiovascular: Negative.   Gastrointestinal: Negative.   Genitourinary: Negative.   Musculoskeletal: Negative.   Skin: Negative.   Neurological: Negative.   Endo/Heme/Allergies: Negative.   Psychiatric/Behavioral: The patient is nervous/anxious.     Blood pressure 116/86, pulse 98, temperature 98 F (36.7 C), temperature source Oral, resp. rate 18, height 5\' 4"  (1.626 m), weight 73.936 kg (163 lb).Body mass index is 27.97 kg/(m^2).  General Appearance: Casual  Eye Contact::  Fair  Speech:  Normal Rate  Volume:  Normal  Mood:  Anxious  Affect:  Congruent  Thought Process:  Coherent  Orientation:  Full (Time, Place, and Person)  Thought Content:  WDL  Suicidal Thoughts:  No  Homicidal Thoughts:  No  Memory:  Immediate;   Fair Recent;   Fair Remote;   Fair  Judgement:  Fair  Insight:  Fair  Psychomotor Activity:  Normal  Concentration:  Good  Recall:  Good  Akathisia:  No  Handed:  Right   AIMS (if indicated):     Assets:  Communication Skills Physical Health Resilience Social Support  Sleep:  Number of Hours: 7.75   Current Medications: Current Facility-Administered Medications  Medication Dose Route Frequency Provider Last Rate Last Dose  . acetaminophen (TYLENOL) tablet 650 mg  650 mg Oral Q6H PRN Kerry Hough, PA-C   650 mg at 09/25/13 0755  . alum & mag hydroxide-simeth (MAALOX/MYLANTA) 200-200-20 MG/5ML suspension 30 mL  30 mL Oral Q4H PRN Kerry Hough, PA-C      . FLUoxetine (PROZAC) capsule 10 mg  10 mg Oral Daily Nehemiah Settle, MD   10 mg at 09/26/13 0844  . lamoTRIgine (LAMICTAL) tablet 25 mg  25 mg Oral QHS Verne Spurr, PA-C   25 mg at 09/25/13 2324  . magnesium hydroxide (MILK OF MAGNESIA) suspension 30 mL  30 mL Oral Daily PRN Kerry Hough, PA-C      . traZODone (DESYREL) tablet 50 mg  50 mg Oral QHS,MR X 1 Kerry Hough, PA-C   50 mg at 09/25/13 2323    Lab Results: No results found for this or any previous visit (from the past 48 hour(s)).  Physical Findings: AIMS: Facial and Oral Movements Muscles of Facial Expression: None, normal Lips and Perioral Area: None, normal Jaw: None, normal Tongue: None, normal,Extremity Movements Upper (arms, wrists, hands, fingers): None, normal Lower (legs, knees, ankles, toes): None, normal, Trunk Movements Neck, shoulders, hips: None, normal, Overall Severity Severity of  abnormal movements (highest score from questions above): None, normal Incapacitation due to abnormal movements: None, normal Patient's awareness of abnormal movements (rate only patient's report): No Awareness, Dental Status Current problems with teeth and/or dentures?: No Does patient usually wear dentures?: No  CIWA:  CIWA-Ar Total: 1 COWS:  COWS Total Score: 2  Treatment Plan Summary: Daily contact with patient to assess and evaluate symptoms and progress in treatment Medication management  Plan:  Review of chart,  vital signs, medications, and notes. 1-Individual and group therapy 2-Medication management for depression and anxiety:  Medications reviewed with the patient and she stated no untoward effects, no changes made 3-Coping skills for depression, anxiety 4-Continue crisis stabilization and management 5-Address health issues--monitoring vital signs, stable 6-Treatment plan in progress to prevent relapse of depression and anxiety  Medical Decision Making Problem Points:  Established problem, stable/improving (1) and Review of psycho-social stressors (1) Data Points:  Review of medication regiment & side effects (2)  I certify that inpatient services furnished can reasonably be expected to improve the patient's condition.   Nanine Means, PMH-NP 09/26/2013, 1:26 PM I agreed with the findings, treatment and disposition plan of this patient. Kathryne Sharper, MD

## 2013-09-26 NOTE — Progress Notes (Signed)
Writer observed patient sitting in the dayroom watching tv but no interaction with peers. Writer spoke with patient 1:1 and she reports that her day has been good and she feels better. Patient is hopeful to discharge on tomorrow and is concerned about her cat being at home with no food. Writer asked if there is someone that could go over and feed her cat but she did not name anyone. Patient was glad to see her mother and boyfriend who visited on today. Patient currently denies si/hi/a/v hallucinations. Support and encouragement offered, safety maintained on unit with 15 min checks.

## 2013-09-26 NOTE — Progress Notes (Signed)
Psychoeducational Group Note  Date: 09/26/2013 Time: 0930  Group Topic/Focus:  Gratefulness:  The focus of this group is to help patients identify what two things they are most grateful for in their lives. What helps ground them and to center them on their work to their recovery.  Participation Level:  Active  Participation Quality: particiates  Affect:  Appropriate  Cognitive:  Oriented  Insight:  Improving  Engagement in Group:  Engaged  Additional Comments:   Jacqueline Andrews A   

## 2013-09-26 NOTE — Progress Notes (Signed)
Psychoeducational Group Note  Date:  09/26/2013 Time:  1015  Group Topic/Focus:  Making Healthy Choices:   The focus of this group is to help patients identify negative/unhealthy choices they were using prior to admission and identify positive/healthier coping strategies to replace them upon discharge.  Participation Level:  Active  Participation Quality:  Appropriate  Affect:  Anxious and Appropriate  Cognitive:  Oriented  Insight:  Improving  Engagement in Group:  Engaged  Additional Comments:    Pearline Yerby A 09/26/2013 

## 2013-09-26 NOTE — [facility] Group Notes (Signed)
[facility] Group Notes:  (Clinical Social Work)  09/26/2013   1:15-2:20PM  Summary of Progress/Problems:   The main focus of today's process group was to   identify the patient's current support system and decide on other supports that can be put in place.  The picture on workbook was used to discuss why additional supports are needed, then used to talk about how patients have given and received all different kinds of support.  An emphasis was placed on using counselor, doctor, therapy groups, 12-step groups, and problem-specific support groups to expand supports.  The patient expressed full comprehension of the concepts presented, and agreed that there is a need to add more supports.  The patient stated the current supports in place are her mother and boyfriend.  She went out of the room for awhile to see a medical practitioner, and did return but listened only, did not contribute to the group.  Type of Therapy:  Process Group  Participation Level:  Active  Participation Quality:  Attentive  Affect:  Blunted and Depressed  Cognitive:  Oriented  Insight:  Developing/Improving  Engagement in Therapy:  Engaged  Modes of Intervention:  Education,  Support and [employer], LCSW 09/26/2013, 3:18 PM

## 2013-09-27 MED ORDER — TRAZODONE HCL 50 MG PO TABS: ORAL_TABLET | ORAL | Status: DC

## 2013-09-27 MED ORDER — LAMOTRIGINE 25 MG PO TABS: 25.0000 mg | ORAL_TABLET | Freq: Every day | ORAL | Status: DC

## 2013-09-27 MED ORDER — FLUOXETINE HCL 10 MG PO TABS: 10.0000 mg | ORAL_TABLET | Freq: Every day | ORAL | Status: DC

## 2013-09-27 MED ORDER — TRAZODONE HCL 50 MG PO TABS
50.0000 mg | ORAL_TABLET | Freq: Every evening | ORAL | Status: DC | PRN
  Filled 2013-09-27: qty 3

## 2013-09-27 NOTE — [facility] Suicide Risk Assessment (Signed)
Suicide Risk Assessment  Discharge Assessment     Demographic Factors:  Adolescent or young adult, Caucasian, Low socioeconomic status and Student  Mental Status Per Nursing Assessment::   On Admission:  Suicidal ideation indicated by patient  Current Mental Status by Physician: NA  Loss Factors: Loss of significant relationship  Historical Factors: Prior suicide attempts, Family history of mental illness or substance abuse and Impulsivity  Risk Reduction Factors:   Sense of responsibility to family, Religious beliefs about death, Living with another person, especially a relative, Positive social support, Positive therapeutic relationship and Positive coping skills or problem solving skills  Continued Clinical Symptoms:  Bipolar Disorder:   Depressive phase Unstable or Poor Therapeutic Relationship Previous Psychiatric Diagnoses and Treatments Medical Diagnoses and Treatments/Surgeries  Cognitive Features That Contribute To Risk:  Polarized thinking    Suicide Risk:  Minimal: No identifiable suicidal ideation.  Patients presenting with no risk factors but with morbid ruminations; may be classified as minimal risk based on the severity of the depressive symptoms  Discharge Diagnoses:   AXIS I:  Bipolar, Depressed AXIS II:  Deferred AXIS III:   Past Medical History  Diagnosis Date  . Spina bifida   . Vaginal agenesis, total   . Congenital fusion of kidneys   . Depression   . Anxiety    AXIS IV:  other psychosocial or environmental problems, problems related to social environment and problems with primary support group AXIS V:  61-70 mild symptoms  Plan Of Care/Follow-up recommendations:  Activity:  As tolerated Diet:  Regular  Is patient on multiple antipsychotic therapies at discharge:  No   Has Patient had three or more failed trials of antipsychotic monotherapy by history:  No  Recommended Plan for Multiple Antipsychotic  Therapies: NA  Nehemiah Settle., M.D. 09/27/2013, 11:45 AM

## 2013-09-27 NOTE — Progress Notes (Signed)
Adult Psychoeducational Group Note  Date:  09/27/2013 Time:  11:00am Group Topic/Focus:  Self Care:   The focus of this group is to help patients understand the importance of self-care in order to improve or restore emotional, physical, spiritual, interpersonal, and financial health.  Participation Level:  Active  Participation Quality:  Appropriate  Affect:  Appropriate  Cognitive:  Alert and Appropriate  Insight: Appropriate  Engagement in Group:  Engaged  Modes of Intervention:  Discussion and Education  Additional Comments:  Pt attended and participated in group. Discussion was on wellness and self care. Pt. Talked about the one thing that made them angry and how they deal with it and what is a more positive way of dealing with their anger. Pt also stated one thing that makes them happy and how they can think of that when they begin to feel anxiety or depression coming on.   Shelly Bombard D 09/27/2013, 1:33 PM

## 2013-09-27 NOTE — Progress Notes (Signed)
D:  Patient's self inventory sheet, patient sleeps well, good appetite, high energy level, improving attention span.  Depression 1, denied hopeless.  Denied withdrawals.  Denied SI.  Denied physical problems.  After discharge, plans to walk/run 3 times a week for 25-30 minutes.  Continue meds and see Ms. Manley and Dr. Mervyn Skeeters regularly.  "My cut has been home alone since I arrived, she is starving I'm sure and needs to be fed.  Also, I'm withdrawing from school for the remaining semester and the last day to withdraw is Tuesday tomorrow and I have a lot of documents to turn in so I won't be academically punished.  It is very crucial that I get amy documentation turned in before noon tomorrow.  The doctor needs to be informed of this please. A:  Medications administered per MD orders.  Emotional support and encouragement given patient. R:  Denied SI and HI.  Denied A/V hallucinations.  Will continue to monitor for safety with 15 minute checks.  Safety maintained.

## 2013-09-27 NOTE — [facility] Group Notes (Signed)
[facility] LCSW Aftercare Discharge Planning Group Note   09/27/2013 10:50 AM    Participation Quality:  Appropraite  Mood/Affect:  Appropriate  Depression Rating:  0  Anxiety Rating:  0  Thoughts of Suicide:  No  Will you contract for safety?   NA  Current AVH:  No  Plan for Discharge/Comments:  Patient attending discharge planning group and actively participated in group.  She reports having a great weekend and being ready to discharge home today.  She will follow up with Dr. Jannifer Franklin and [facility].   CSW provided all participants with daily workbook.   Transportation Means: Patient has transportation.   Supports:  Patient has a support system.   Calypso Hagarty, Joesph July

## 2013-09-27 NOTE — Tx Team (Signed)
Interdisciplinary Treatment Plan Update   Date Reviewed:  09/27/2013  Time Reviewed:  10:08 AM  Progress in Treatment:   Attending groups: Yes Participating in groups: Yes Taking medication as prescribed: Yes  Tolerating medication: Yes Family/Significant other contact made: Yes, contact made with mothr. Patient understands diagnosis: Yes  Discussing patient identified problems/goals with staff: Yes Medical problems stabilized or resolved: Yes Denies suicidal/homicidal ideation: Yes Patient has not harmed self or others: Yes  For review of initial/current patient goals, please see plan of care.  Estimated Length of Stay:  Discharge today  Reasons for Continued Hospitalization:   New Problems/Goals identified:    Discharge Plan or Barriers:   Home with outpatient follow up with Dr. Jannifer Franklin   Additional Comments:  N/A  Attendees:  Patient: Jacqueline Andrews 09/27/2013 10:08 AM   Signature: Mervyn Gay, MD 09/27/2013 10:08 AM  Signature:  Verne Spurr, PA 09/27/2013 10:08 AM  Signature:Donna Shimp, RN 09/27/2013 10:08 AM  Signature: Cammy Brochure, RN 09/27/2013 10:08 AM  Signature:  Neill Loft, RN 09/27/2013 10:08 AM  Signature:  Juline Patch, LCSW 09/27/2013 10:08 AM  Signature:  Quintella Reichert, RN 09/27/2013 10:08 AM  Signature:  09/27/2013 10:08 AM  Signature:   09/27/2013 10:08 AM  Signature:  09/27/2013  10:08 AM  Signature:   09/27/2013  10:08 AM  Signature:   09/27/2013  10:08 AM    Scribe for Treatment Team:   Juline Patch,  09/27/2013 10:08 AM

## 2013-09-27 NOTE — Progress Notes (Signed)
Patient ID: Jacqueline Andrews, female   DOB: June 19, 1991, 22 y.o.   MRN: [id number] Discharge Note:  Patient discharged with friend to her home.  Patient denied SI and HI.  Contracts for safety.  Denied A/V hallucinations.  Denied pain.  Patient received all her belongings, clothing, miscellaneous items, prescriptions, medications, scrubs, food.  Suicide prevention information given and discussed with patient who stated she understood and had no questions.  Patient stated she appreciated all assistance received from [facility] staff.

## 2013-09-27 NOTE — Progress Notes (Signed)
[facility] Adult Case Management Discharge Plan :  Will you be returning to the same living situation after discharge: Yes,  Patient to return to her home. At discharge, do you have transportation home?:Yes,  Patient's boyfriend to transport her home. Do you have the ability to pay for your medications:Yes,  Patient reports she can afford medications.  Release of information consent forms completed and in the chart;  Patient's signature needed at discharge.  Patient to Follow up at: Follow-up Information   Follow up with Dr. Jannifer [facility] On 09/30/2013. ( Thursday, September 30, 2013 at 2:10 PM)    Contact information:   [address] [place], [place]   [address]  [phone]      Follow up with Jacqueline Andrews - [facility] On 10/06/2013. (Wednesday, October 06, 2013 at 4PM)    Contact information:   [phone] [address] [place], [place]   [address]  [phone]      Patient denies SI/HI:  Patient no longer endorsing SI/HI or other thoughts of self harm.  Safety Planning and Suicide Prevention discussed: .Reviewed with all patients during discharge planning group  Jacqueline Andrews, Jacqueline Andrews July 09/27/2013, 10:51 AM

## 2013-09-27 NOTE — Progress Notes (Signed)
Pt attended spiritual care group on grief and loss facilitated by chaplain Burnis Kingfisher.  Group opened with brief discussion and psycho-social ed around grief and loss in relationships and in relation to self - identifying life patterns, circumstances, changes that cause losses. Established group norm of speaking from own life experience. Group goal of establishing open and affirming space for members to share loss and experience with grief, normalize grief experience and provide psycho social education and grief support.  Jacqueline Andrews was present and attentive throughout group.  Jacqueline Andrews shared with group members about the loss of her grandmother prior to starting school in this fall.  Shared about support of her brothers and valuing her family's emotional connection.   Belva Crome MDiv

## 2013-09-27 NOTE — Discharge Summary (Signed)
Physician Discharge Summary Note  Patient:  Jacqueline Andrews is an 22 y.o., female MRN:  [id number] DOB:  12/15/1990 Patient phone:  [phone] (home)  Patient address:   [address] [place] [address],   Date of Admission:  09/23/2013 Date of Discharge: 09/27/2013  Reason for Admission:  Suicide attempt  Discharge Diagnoses: Principal Problem:   Bipolar I disorder, most recent episode (or current) depressed, severe, without mention of psychotic behavior  ROS  DSM5: Schizophrenia Disorders:  Obsessive-Compulsive Disorders:  Trauma-Stressor Disorders:  Substance/Addictive Disorders:  Depressive Disorders: Bipolar disorder most recent episode depressed with psychotic features.  AXIS I: Bipolar disorder recurrent most recent episode depressed with psychotic features.  AXIS II: Cluster B Traits  AXIS III:  Past Medical History   Diagnosis  Date   .  Spina bifida    .  Vaginal agenesis, total    .  Congenital fusion of kidneys    .  Depression    .  Anxiety     AXIS IV: educational problems  AXIS V: 51-60 moderate symptoms  Level of Care:  OP  Hospital Course:   Jacqueline Andrews is a 22 year old woman brought to the [facility] on the advice of her psychotherapist at [facility] after she reported that she had taken an unknown amount of prescription tegratol in a suicide attempt.       She was felt to be in need of acute psychiatric hospitalization and was accepted to [facility] for crisis management and stabilization. Upon arrival at the unit the patient was evaluated and medication management was re-initiated. The patient reported that she had been non compliant due to financial reasons. She has missed the majority of her classes this semester saying that her social anxiety has gotten the best of her. However she reports that her suicide attempt followed after a weekend of homecoming celebrations she attended. She stated that she "self medicated with alcohol" to get over her social anxiety and was  drunk the entire weekend.           Jacqueline Andrews was evaluated each day by a clinical provider to ascertain the patient's response to treatment.  Improvement was noted by the patient's report of decreasing symptoms, improved sleep and appetite, affect, medication tolerance, behavior, and participation in unit programming.  Jacqueline Andrews asked each day to complete a self inventory noting mood, mental status, pain, new symptoms, anxiety and concerns.         She responded well to medication and being in a therapeutic and supportive environment. Positive and appropriate behavior was noted and the patient was motivated for recovery.  Jacqueline Andrews closely with the treatment team and case manager to develop a discharge plan with appropriate goals. Coping skills, problem solving as well as relaxation therapies were also part of the unit programming.         By the day of discharge Jacqueline Andrews was in much improved condition than upon admission.  Symptoms were reported as significantly decreased or resolved completely.  The patient denied SI/HI and voiced no AVH. She was motivated to continue taking medication with a goal of continued improvement in mental health.          Jacqueline Andrews was discharged home with a plan to follow up as noted below.   Consults:  None  Significant Diagnostic Studies:  labs: CBC, CMP, UA, UDS, TOX  Discharge Vitals:   Blood pressure 113/71, pulse 86, temperature 98 F (36.7 C),  temperature source Oral, resp. rate 16, height 5\' 4"  (1.626 m), weight 73.936 kg (163 lb). Body mass index is 27.97 kg/(m^2). Lab Results:   No results found for this or any previous visit (from the past 72 hour(s)).  Physical Findings: AIMS: Facial and Oral Movements Muscles of Facial Expression: None, normal Lips and Perioral Area: None, normal Jaw: None, normal Tongue: None, normal,Extremity Movements Upper (arms, wrists, hands, fingers): None, normal Lower (legs,  knees, ankles, toes): None, normal, Trunk Movements Neck, shoulders, hips: None, normal, Overall Severity Severity of abnormal movements (highest score from questions above): None, normal Incapacitation due to abnormal movements: None, normal Patient's awareness of abnormal movements (rate only patient's report): No Awareness, Dental Status Current problems with teeth and/or dentures?: No Does patient usually wear dentures?: No  CIWA:  CIWA-Ar Total: 0 COWS:  COWS Total Score: 0  Psychiatric Specialty Exam: See Psychiatric Specialty Exam and Suicide Risk Assessment completed by Attending Physician prior to discharge.  Discharge destination:  Home  Is patient on multiple antipsychotic therapies at discharge:  No   Has Patient had three or more failed trials of antipsychotic monotherapy by history:  No  Recommended Plan for Multiple Antipsychotic Therapies: NA  Discharge Orders   Future Orders Complete By Expires   Diet - low sodium heart healthy  As directed    Discharge instructions  As directed    Comments:     Take all of your medications as directed. Be sure to keep all of your follow up appointments.  If you are unable to keep your follow up appointment, call your Doctor's office to let them know, and reschedule.  Make sure that you have enough medication to last until your appointment. Be sure to get plenty of rest. Going to bed at the same time each night will help. Try to avoid sleeping during the day.  Increase your activity as tolerated. Regular exercise will help you to sleep better and improve your mental health. Eating a heart healthy diet is recommended. Try to avoid salty or fried foods. Be sure to avoid all alcohol and illegal drugs.   Increase activity slowly  As directed        Medication List    STOP taking these medications       carbamazepine 200 MG 12 hr tablet  Commonly known as:  TEGRETOL XR      TAKE these medications     Indication   FLUoxetine 10  MG tablet  Commonly known as:  PROZAC  Take 1 tablet (10 mg total) by mouth daily. For depression.   Indication:  Depression     lamoTRIgine 25 MG tablet  Commonly known as:  LAMICTAL  Take 1 tablet (25 mg total) by mouth at bedtime. For mood stabilization.   Indication:  Manic-Depression, Depression     traZODone 50 MG tablet  Commonly known as:  DESYREL  Take one tablet at bedtime if needed for insomnia.   Indication:  Trouble Sleeping           Follow-up Information   Follow up with Dr. Jannifer [facility] On 09/30/2013. ( Thursday, September 30, 2013 at 2:10 PM)    Contact information:   [address] [place], [place]   [address]  [phone]      Follow up with Erenest Blank [facility] On 10/06/2013. (Wednesday, October 06, 2013 at 4PM)    Contact information:   [id number] [place], [place]   [address]  [phone]  Follow-up recommendations:   Activities: Resume activity as tolerated. Diet: Heart healthy low sodium diet Tests: Follow up testing will be determined by your out patient provider. Comments:    Total Discharge Time:  Less than 30 minutes.  Signed: Rona Ravens. Mashburn RPAC 9:17 AM 09/30/2013  Patient is seen face-to-face for psychiatric evaluation, suicide risk assessment, case discussed with the physician extender and made discharge plan.Reviewed the information documented and agree with the treatment plan.  Amarisa Wilinski,JANARDHAHA R. 10/03/2013 3:47 PM

## 2013-09-27 NOTE — Progress Notes (Signed)
[facility] Group Notes:  (Nursing/MHT/Case Management/Adjunct)  Date:  09/27/2013  Time:  3:36 PM  Type of Therapy:  Psychoeducational Skills  Participation Level:  Active  Participation Quality:  Appropriate  Affect:  Appropriate  Cognitive:  Appropriate  Insight:  Appropriate  Engagement in Group:  Engaged and Supportive  Modes of Intervention:  Activity  Summary of Progress/Problems:  [name] C 09/27/2013, 3:36 PM 

## 2013-09-29 NOTE — Progress Notes (Signed)
Patient Discharge Instructions:  After Visit Summary (AVS):   Faxed to:  09/29/13 Psychiatric Admission Assessment Note:   Faxed to:  09/29/13 Suicide Risk Assessment - Discharge Assessment:   Faxed to:  09/29/13 Faxed/Sent to the Next Level Care provider:  09/29/13 Faxed to [facility] @ [phone] Faxed to [facility] @ [phone]  Jerelene Redden, 09/29/2013, 3:49 PM

## 2013-09-30 NOTE — ED Provider Notes (Signed)
Medical screening examination/treatment/procedure(s) were performed by non-physician practitioner and as supervising physician I was immediately available for consultation/collaboration.  EKG Interpretation     Ventricular Rate:  68 PR Interval:  142 QRS Duration: 75 QT Interval:  386 QTC Calculation: 410 R Axis:   40 Text Interpretation:  Sinus rhythm ED PHYSICIAN INTERPRETATION AVAILABLE IN [place] Ashley Mariner, MD 09/30/13 1710

## 2013-12-19 IMAGING — US US PELVIS COMPLETE
1 series · 14 of 19 positions shown · non-contrast
Comparison: CT 06/30/2011

CLINICAL DATA: Pelvic pain.  Genitourinary anomaly.  Agenesis of
the uterus and vagina.  Pelvic kidney

TRANSABDOMINAL ULTRASOUND OF PELVIS
TECHNIQUE: Transabdominal ultrasound examination of the pelvis was
performed including evaluation of the uterus, ovaries, adnexal
regions, and pelvic cul-de-sac.

[Series 1: us pelvis complete · 14 of 19 slices shown]
[im 1/19]
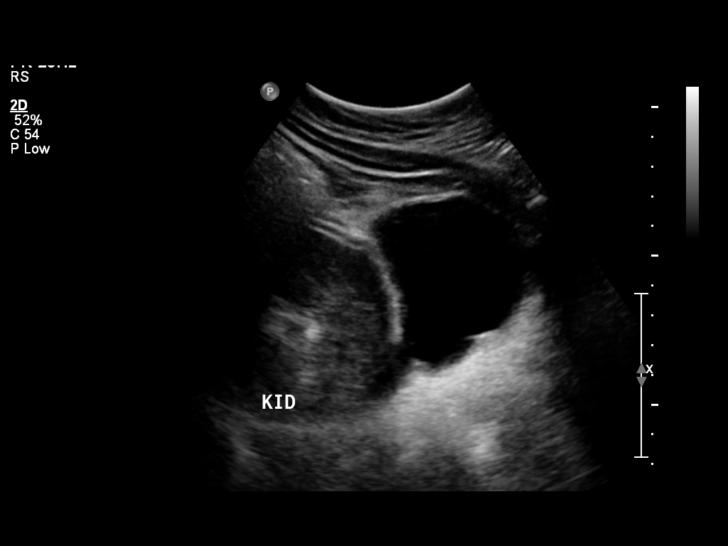
[im 3/19]
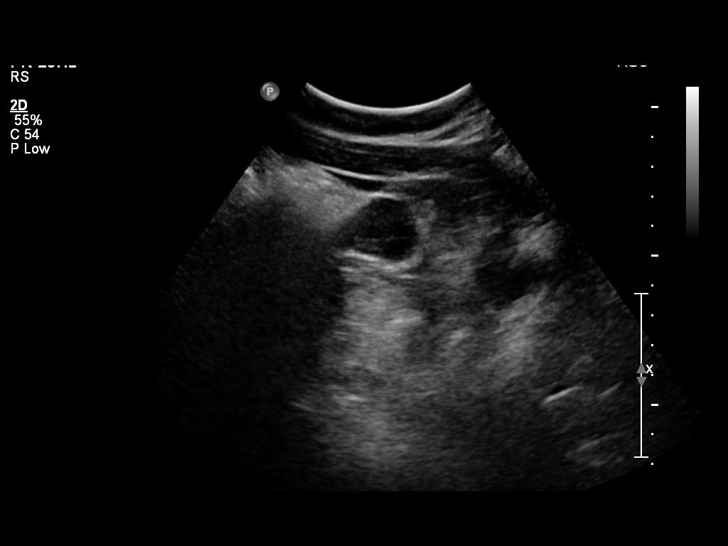
[im 4/19]
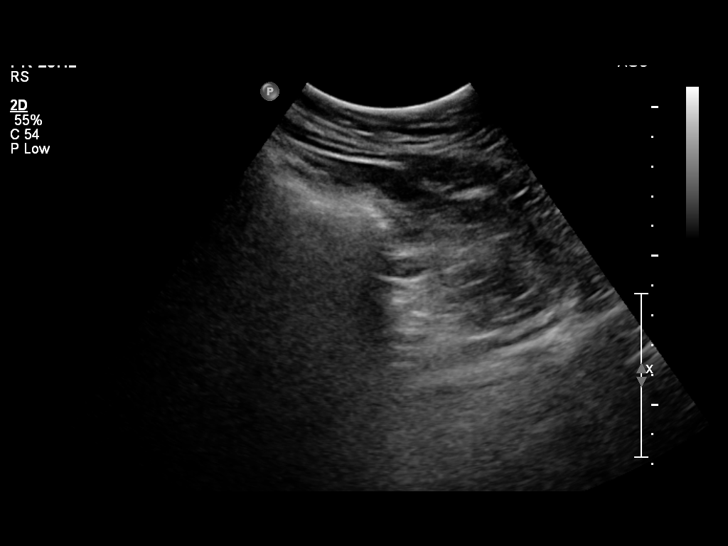
[im 5/19]
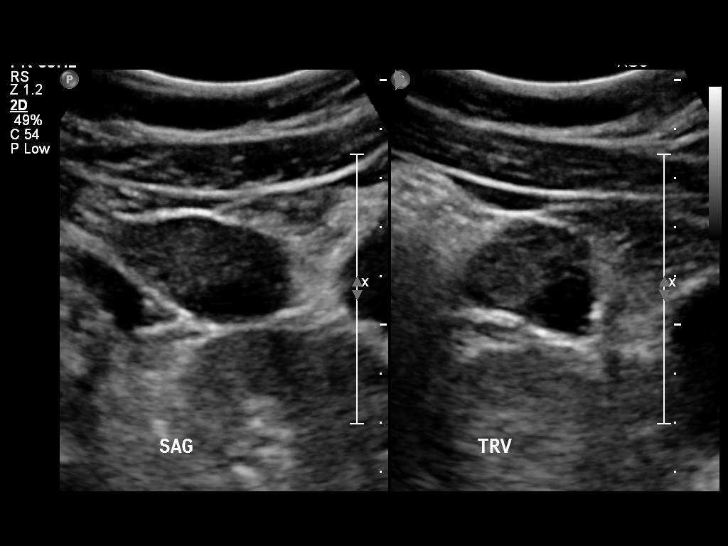
[im 7/19]
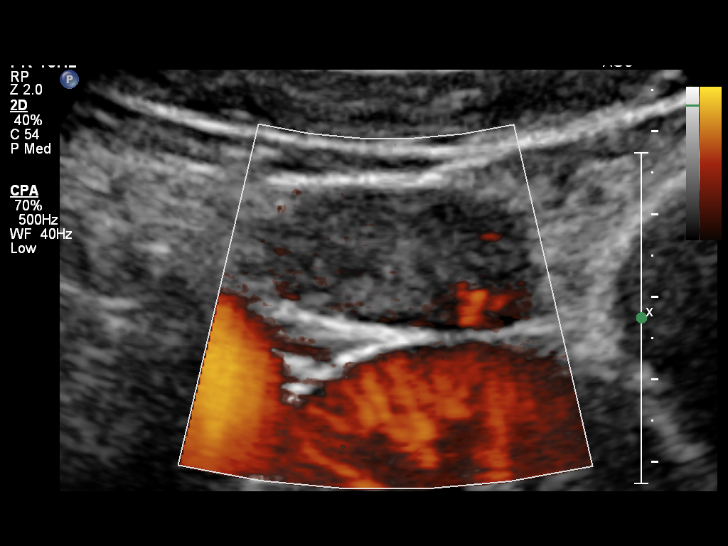
[im 8/19]
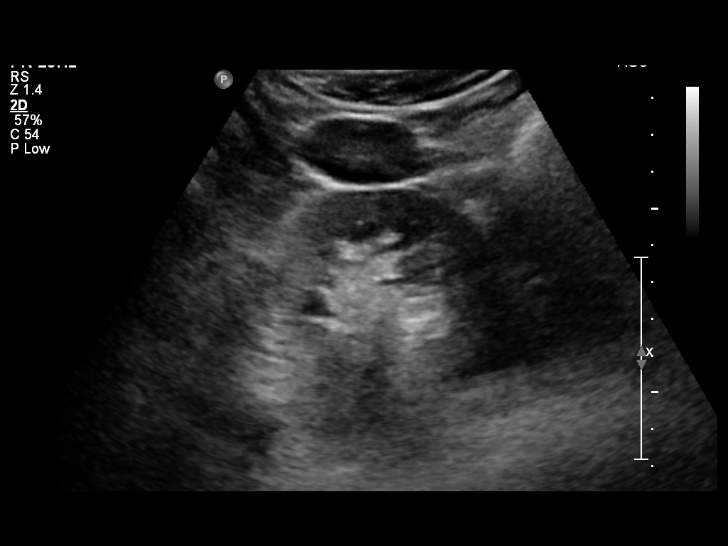
[im 9/19]
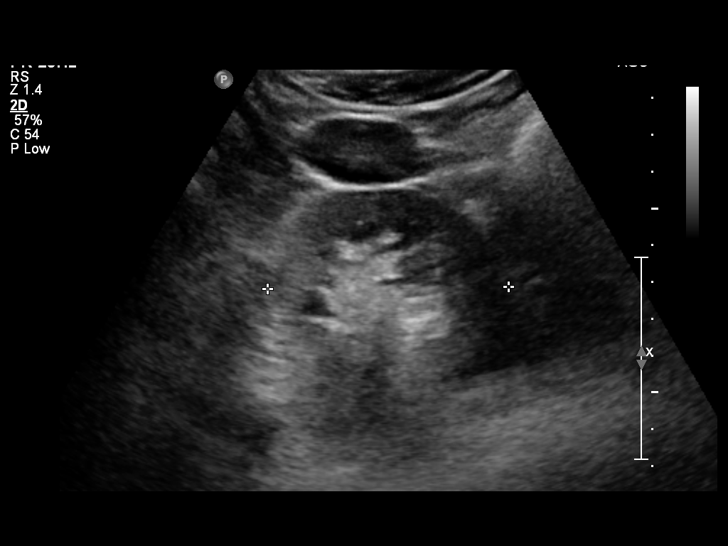
[im 11/19]
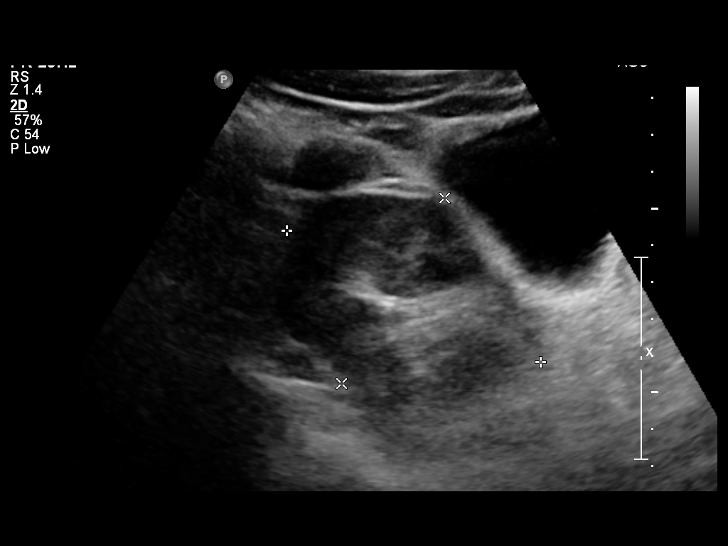
[im 12/19]
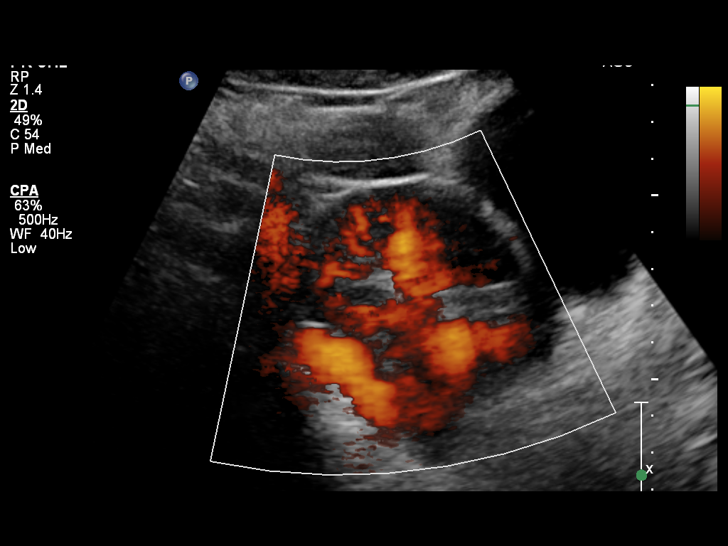
[im 13/19]
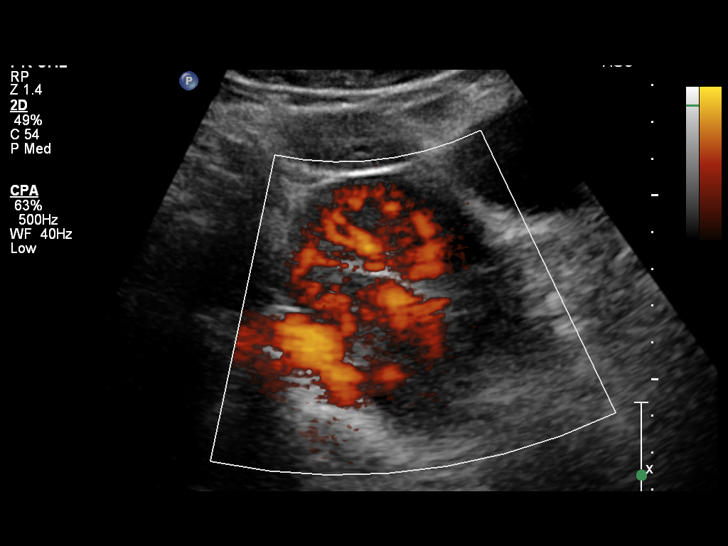
[im 15/19]
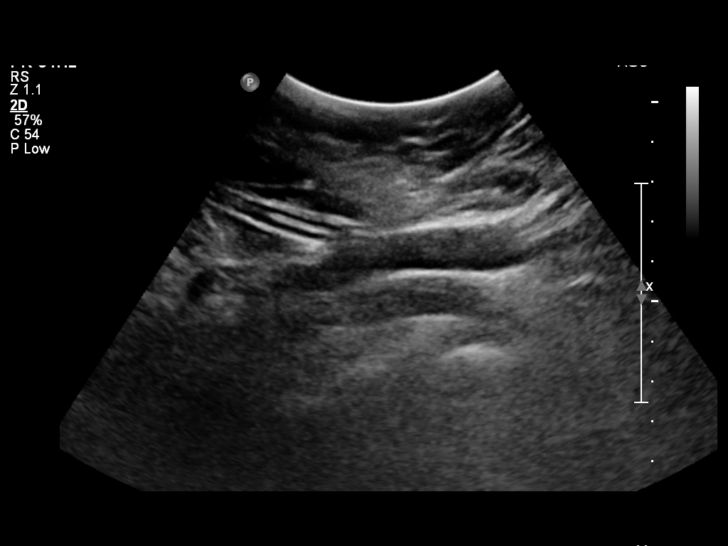
[im 16/19]
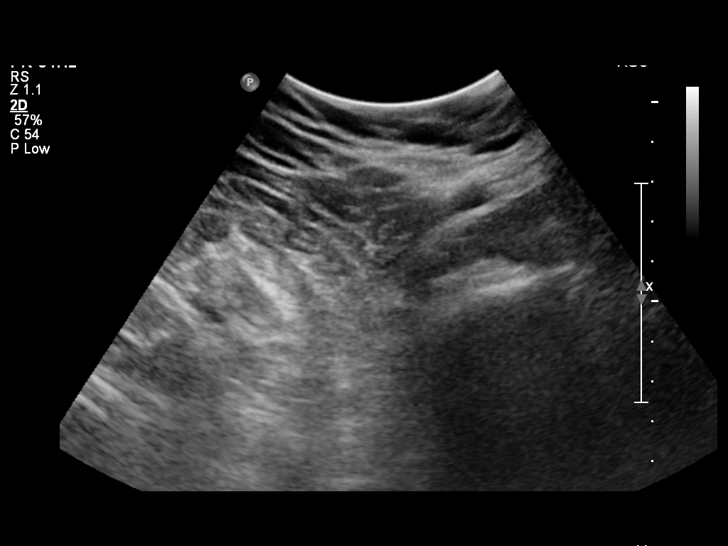
[im 17/19]
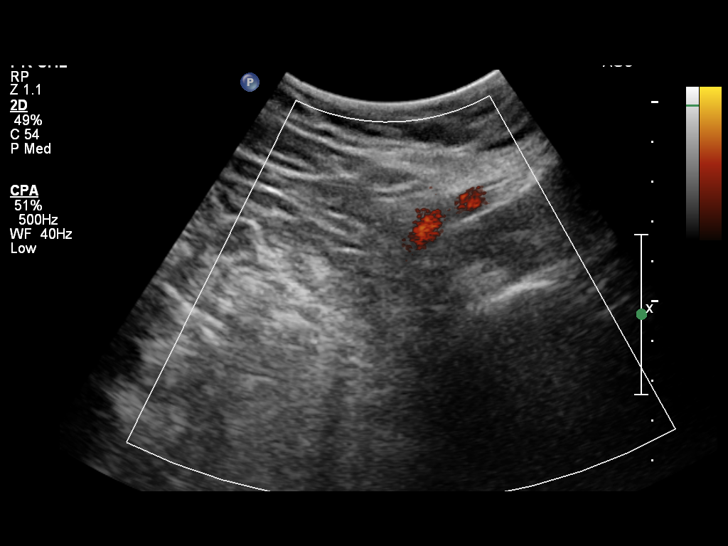
[im 19/19]
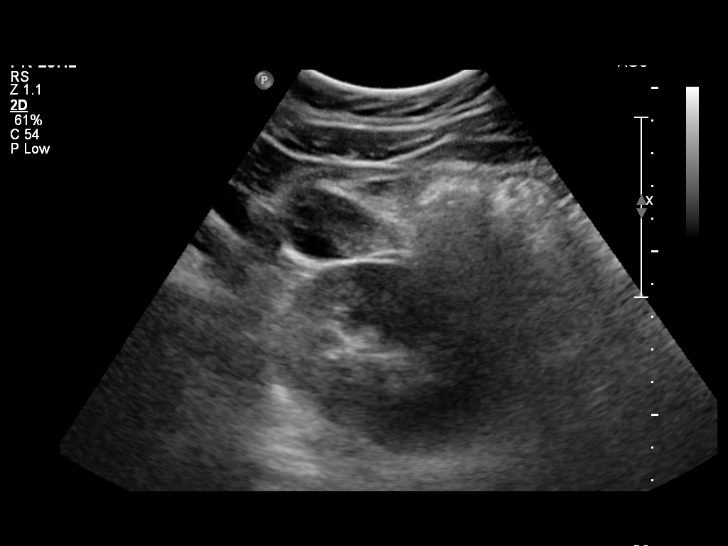

[14 of 19 positions shown; findings below may reference images not displayed]

FINDINGS: Uterus:  Congenitally absent

Endometrium: Absent

Right ovary: 3.9 x 2.1 x 2.7 cm.  Normal

Left ovary: Not visualized

Other Findings:  No free fluid.  Pelvic kidney is present in the
midline.  This is a single kidney with no kidneys seen in the renal
fossa on the prior CT.  No hydronephrosis is present.
IMPRESSION: No acute abnormality.

## 2013-12-20 ENCOUNTER — Encounter (HOSPITAL_COMMUNITY): Payer: Self-pay | Admitting: Emergency Medicine

## 2013-12-20 DIAGNOSIS — R42 Dizziness and giddiness: Secondary | ICD-10-CM | POA: Insufficient documentation

## 2013-12-20 NOTE — ED Notes (Addendum)
Pt believes that she has Acute Mountain syndrome. States that she was [place] yesterday and after coming [place] off the [place] she started feeling dizzy and ill and stated that she had "muscle atrophy" on her L side.

## 2013-12-21 ENCOUNTER — Emergency Department (HOSPITAL_COMMUNITY)
Admission: EM | Admit: 2013-12-21 | Discharge: 2013-12-21 | Discharge status: Against Medical Advice | Payer: BC Managed Care – PPO | Attending: Emergency Medicine | Admitting: Emergency Medicine

## 2013-12-21 ENCOUNTER — Emergency Department (HOSPITAL_COMMUNITY)
Admission: EM | Admit: 2013-12-21 | Discharge: 2013-12-21 | Disposition: A | Discharge status: Home / Self Care | Payer: BC Managed Care – PPO | Attending: Emergency Medicine | Admitting: Emergency Medicine

## 2013-12-21 ENCOUNTER — Encounter (HOSPITAL_COMMUNITY): Payer: Self-pay | Admitting: Emergency Medicine

## 2013-12-21 DIAGNOSIS — Z79899 Other long term (current) drug therapy: Secondary | ICD-10-CM | POA: Insufficient documentation

## 2013-12-21 DIAGNOSIS — R630 Anorexia: Secondary | ICD-10-CM | POA: Insufficient documentation

## 2013-12-21 DIAGNOSIS — R599 Enlarged lymph nodes, unspecified: Secondary | ICD-10-CM | POA: Insufficient documentation

## 2013-12-21 DIAGNOSIS — R110 Nausea: Secondary | ICD-10-CM | POA: Insufficient documentation

## 2013-12-21 DIAGNOSIS — R0602 Shortness of breath: Secondary | ICD-10-CM | POA: Insufficient documentation

## 2013-12-21 DIAGNOSIS — F411 Generalized anxiety disorder: Secondary | ICD-10-CM | POA: Insufficient documentation

## 2013-12-21 DIAGNOSIS — J029 Acute pharyngitis, unspecified: Secondary | ICD-10-CM

## 2013-12-21 DIAGNOSIS — F319 Bipolar disorder, unspecified: Secondary | ICD-10-CM | POA: Insufficient documentation

## 2013-12-21 DIAGNOSIS — Z87728 Personal history of other specified (corrected) congenital malformations of nervous system and sense organs: Secondary | ICD-10-CM | POA: Insufficient documentation

## 2013-12-21 DIAGNOSIS — Z87718 Personal history of other specified (corrected) congenital malformations of genitourinary system: Secondary | ICD-10-CM | POA: Insufficient documentation

## 2013-12-21 DIAGNOSIS — R42 Dizziness and giddiness: Secondary | ICD-10-CM | POA: Insufficient documentation

## 2013-12-21 MED ORDER — IBUPROFEN 800 MG PO TABS
800.0000 mg | ORAL_TABLET | Freq: Once | ORAL | Status: AC
  Administered 2013-12-21: 800 mg via ORAL
  Filled 2013-12-21: qty 1

## 2013-12-21 NOTE — ED Notes (Signed)
Pt reports dizziness and sore throat pain 9/10. Sent to urgent care this morning, neg for strep and flu. Dx with laryngitis. Reports she wants to be checked again by someone knowledgeable. Thought she might have mountain sickness from hiking to [place].

## 2013-12-21 NOTE — ED Provider Notes (Signed)
CSN: [id number]     Arrival date & time 12/21/13  1751 History   This chart was scribed for non-physician practitioner Ivonne AndrewPeter Trell Secrist, PA-C working with Audree CamelScott T Goldston, MD by Valera CastleSteven Perry, ED scribe. This patient was seen in room WTR9/WTR9 and the patient's care was started at 8:45 PM.   Chief Complaint  Patient presents with  . Sore Throat  . Dizziness    The history is provided by the patient. No language interpreter was used.   HPI Comments: Jacqueline Andrews is a 23 y.o. female who presents to the Emergency Department complaining of acute mountain sickness. She reports [place] ago. She reports 2 hours after hiking, on her way home, she experienced nausea, dizziness, SOB, and right arm pain. She states nausea and arm pain subsided quickly, but reports intermittent dizziness, SOB since onset. She also reports constant, bilateral sore throat, and states it hurts to breathe, swallow. She reports subjective fever yesterday. She also reports decreased appetite due to sore throat. She denies hiking often at higher elevations. She denies staying overnight on the mountain, just a day trip. She reports drinking enough fluids that day. [facility]. She reports being diagnosed with Laryngitis by NP. She denies urinary symptoms, vaginal discharge, vaginal bleeding, vomiting, ear pain, diarrhea, and any other associated symptoms. She denies h/o STD. She reports h/o bipolar disorder, depression, anxiety, fusion of kidneys.   PCP - Pcp Not In System  Past Medical History  Diagnosis Date  . Spina bifida   . Vaginal agenesis, total   . Congenital fusion of kidneys   . Depression   . Anxiety    Past Surgical History  Procedure Laterality Date  . Diagnostic laparoscopy    . Ovarian cyst removal     History reviewed. No pertinent family history. History  Substance Use Topics  . Smoking status: Never Smoker   . Smokeless  tobacco: Never Used  . Alcohol Use: No   OB History   Grav Para Term Preterm Abortions TAB SAB Ect Mult Living                 Review of Systems  Constitutional: Positive for appetite change (decreased due to sore throat). Negative for fever.  HENT: Positive for sore throat. Negative for congestion, ear pain and rhinorrhea.   Respiratory: Positive for shortness of breath.   Gastrointestinal: Positive for nausea. Negative for vomiting, abdominal pain and diarrhea.  Genitourinary: Negative for dysuria, frequency, vaginal bleeding and vaginal discharge.  Neurological: Positive for dizziness. Negative for syncope.  All other systems reviewed and are negative.   Allergies  Fentanyl; Midazolam hcl; Antihistamines, chlorpheniramine-type; Oxycodone hcl; and Sulfa antibiotics  Home Medications   Current Outpatient Rx  Name  Route  Sig  Dispense  Refill  . FLUoxetine (PROZAC) 20 MG tablet   Oral   Take 20 mg by mouth daily.         Marland Kitchen. lamoTRIgine (LAMICTAL) 25 MG tablet   Oral   Take 1 tablet (25 mg total) by mouth at bedtime. For mood stabilization.   30 tablet   0   . meclizine (ANTIVERT) 12.5 MG tablet   Oral   Take 12.5 mg by mouth every 4 (four) hours as needed for dizziness.         . Multiple Vitamin (MULTIVITAMIN WITH MINERALS) TABS tablet   Oral   Take 1 tablet by mouth daily.         .Marland Kitchen  traZODone (DESYREL) 50 MG tablet      Take one tablet at bedtime if needed for insomnia.   30 tablet   0    BP 122/85  Pulse 109  Temp(Src) 100 F (37.8 C) (Oral)  Resp 16  SpO2 96%  Physical Exam  Nursing note and vitals reviewed. Constitutional: She is oriented to person, place, and time. She appears well-developed and well-nourished. No distress.  HENT:  Head: Normocephalic and atraumatic.  Right Ear: Tympanic membrane, external ear and ear canal normal.  Left Ear: Tympanic membrane, external ear and ear canal normal.  Nose: Nose normal.  Mouth/Throat: Uvula is  midline. No trismus in the jaw. Oropharyngeal exudate (right tonsil) and posterior oropharyngeal erythema present. No posterior oropharyngeal edema or tonsillar abscesses.  Handling secretions well.  Eyes: EOM are normal.  Neck: Neck supple.  Cardiovascular: Normal rate, regular rhythm and normal heart sounds.   No murmur heard. Pulmonary/Chest: Effort normal and breath sounds normal. No respiratory distress. She has no wheezes. She has no rales.  Abdominal: Soft. There is no tenderness.  Musculoskeletal: Normal range of motion.  Lymphadenopathy:    She has cervical adenopathy (mild posterior cervical adenopathy).  Neurological: She is alert and oriented to person, place, and time.  Skin: Skin is warm and dry.  Psychiatric: She has a normal mood and affect. Her behavior is normal.   ED Course  Procedures   DIAGNOSTIC STUDIES: Oxygen Saturation is 96% on room air, normal by my interpretation.    COORDINATION OF CARE: 8:53 PM-Discussed treatment plan including clinical doubt of acute mountain sickness with pt at bedside and pt agreed to plan. Discussed flu and strep results with pt. Recommended pt f/u if symptoms worsen.   Patient appears well. Symptoms not consistent with acute mountain sickness although patient concern for this. She was not at a significant elevation and symptoms began on her drive home.  Patient was seen earlier today at urgent care. Negative strep throat test, negative flu swab. She does have low-grade fever. Otherwise appears well nontoxic. Signs of pharyngitis. No other concerning symptoms. I discussed options for repeat strep test with the patient and other testing she did not wish to have this performed. She was given return precautions.    MDM   1. Pharyngitis        I personally performed the services described in this documentation, which was scribed in my presence. The recorded information has been reviewed and is accurate.    Angus Seller,  PA-C 12/21/13 2113

## 2013-12-21 NOTE — Discharge Instructions (Signed)
Take ibuprofen and Tylenol for your fever and sore throat symptoms. Use warm saltwater gargles frequently to help with sore throat. You may also use over-the-counter Cepacol throat lozenge to help with symptoms. Drink plenty of fluids to stay hydrated.    Viral Pharyngitis Viral pharyngitis is a viral infection that produces redness, pain, and swelling (inflammation) of the throat. It can spread from person to person (contagious). CAUSES Viral pharyngitis is caused by inhaling a large amount of certain germs called viruses. Many different viruses cause viral pharyngitis. SYMPTOMS Symptoms of viral pharyngitis include:  Sore throat.  Tiredness.  Stuffy nose.  Low-grade fever.  Congestion.  Cough. TREATMENT Treatment includes rest, drinking plenty of fluids, and the use of over-the-counter medication (approved by your caregiver). HOME CARE INSTRUCTIONS   Drink enough fluids to keep your urine clear or pale yellow.  Eat soft, cold foods such as ice cream, frozen ice pops, or gelatin dessert.  Gargle with warm salt water (1 tsp salt per 1 qt of water).  If over age 667, throat lozenges may be used safely.  Only take over-the-counter or prescription medicines for pain, discomfort, or fever as directed by your caregiver. Do not take aspirin. To help prevent spreading viral pharyngitis to others, avoid:  Mouth-to-mouth contact with others.  Sharing utensils for eating and drinking.  Coughing around others. SEEK MEDICAL CARE IF:   You are better in a few days, then become worse.  You have a fever or pain not helped by pain medicines.  There are any other changes that concern you. Document Released: 08/21/2005 Document Revised: 02/03/2012 Document Reviewed: 01/17/2011 [facility] Patient Information 2014 [employer] [place].

## 2013-12-21 NOTE — ED Notes (Signed)
Pt called x2 for room, no response. 

## 2013-12-22 NOTE — ED Provider Notes (Signed)
Medical screening examination/treatment/procedure(s) were performed by non-physician practitioner and as supervising physician I was immediately available for consultation/collaboration.  EKG Interpretation   None         Audree CamelScott T Lelania Bia, MD 12/22/13 1025

## 2013-12-23 ENCOUNTER — Encounter (HOSPITAL_COMMUNITY): Payer: Self-pay | Admitting: Emergency Medicine

## 2013-12-23 ENCOUNTER — Emergency Department (HOSPITAL_COMMUNITY)
Admission: EM | Admit: 2013-12-23 | Discharge: 2013-12-23 | Disposition: A | Discharge status: Home / Self Care | Payer: BC Managed Care – PPO | Attending: Emergency Medicine | Admitting: Emergency Medicine

## 2013-12-23 DIAGNOSIS — F329 Major depressive disorder, single episode, unspecified: Secondary | ICD-10-CM | POA: Insufficient documentation

## 2013-12-23 DIAGNOSIS — J029 Acute pharyngitis, unspecified: Secondary | ICD-10-CM

## 2013-12-23 DIAGNOSIS — Q638 Other specified congenital malformations of kidney: Secondary | ICD-10-CM | POA: Insufficient documentation

## 2013-12-23 DIAGNOSIS — Q520 Congenital absence of vagina: Secondary | ICD-10-CM | POA: Insufficient documentation

## 2013-12-23 DIAGNOSIS — Q054 Unspecified spina bifida with hydrocephalus: Secondary | ICD-10-CM | POA: Insufficient documentation

## 2013-12-23 DIAGNOSIS — Z3202 Encounter for pregnancy test, result negative: Secondary | ICD-10-CM | POA: Insufficient documentation

## 2013-12-23 DIAGNOSIS — F3289 Other specified depressive episodes: Secondary | ICD-10-CM | POA: Insufficient documentation

## 2013-12-23 DIAGNOSIS — F411 Generalized anxiety disorder: Secondary | ICD-10-CM | POA: Insufficient documentation

## 2013-12-23 DIAGNOSIS — Z79899 Other long term (current) drug therapy: Secondary | ICD-10-CM | POA: Insufficient documentation

## 2013-12-23 LAB — CBC WITH DIFFERENTIAL/PLATELET
BASOS PCT: 1 % (ref 0–1)
Basophils Absolute: 0.1 10*3/uL (ref 0.0–0.1)
EOS PCT: 0 % (ref 0–5)
Eosinophils Absolute: 0 10*3/uL (ref 0.0–0.7)
HCT: 38.8 % (ref 36.0–46.0)
HEMOGLOBIN: 13.3 g/dL (ref 12.0–15.0)
Lymphocytes Relative: 15 % (ref 12–46)
Lymphs Abs: 2 10*3/uL (ref 0.7–4.0)
MCH: 31.4 pg (ref 26.0–34.0)
MCHC: 34.3 g/dL (ref 30.0–36.0)
MCV: 91.5 fL (ref 78.0–100.0)
Monocytes Absolute: 1.7 10*3/uL — ABNORMAL HIGH (ref 0.1–1.0)
Monocytes Relative: 13 % — ABNORMAL HIGH (ref 3–12)
NEUTROS PCT: 71 % (ref 43–77)
Neutro Abs: 9.6 10*3/uL — ABNORMAL HIGH (ref 1.7–7.7)
Platelets: 268 10*3/uL (ref 150–400)
RBC: 4.24 MIL/uL (ref 3.87–5.11)
RDW: 12.9 % (ref 11.5–15.5)
WBC: 13.4 10*3/uL — ABNORMAL HIGH (ref 4.0–10.5)

## 2013-12-23 LAB — BASIC METABOLIC PANEL
BUN: 9 mg/dL (ref 6–23)
CO2: 23 meq/L (ref 19–32)
Calcium: 9.3 mg/dL (ref 8.4–10.5)
Chloride: 96 mEq/L (ref 96–112)
Creatinine, Ser: 1.11 mg/dL — ABNORMAL HIGH (ref 0.50–1.10)
GFR calc Af Amer: 80 mL/min — ABNORMAL LOW (ref >90)
GFR calc non Af Amer: 69 mL/min — ABNORMAL LOW (ref >90)
Glucose, Bld: 119 mg/dL — ABNORMAL HIGH (ref 70–99)
Potassium: 3.7 mEq/L (ref 3.7–5.3)
SODIUM: 134 meq/L — AB (ref 137–147)

## 2013-12-23 LAB — POCT PREGNANCY, URINE: Preg Test, Ur: NEGATIVE

## 2013-12-23 LAB — RAPID STREP SCREEN (MED CTR MEBANE ONLY): Streptococcus, Group A Screen (Direct): NEGATIVE

## 2013-12-23 MED ORDER — IBUPROFEN 600 MG PO TABS: 600.0000 mg | ORAL_TABLET | Freq: Four times a day (QID) | ORAL | Status: DC | PRN

## 2013-12-23 MED ORDER — DEXAMETHASONE SODIUM PHOSPHATE 10 MG/ML IJ SOLN
10.0000 mg | Freq: Once | INTRAMUSCULAR | Status: AC
  Administered 2013-12-23: 10 mg via INTRAVENOUS
  Filled 2013-12-23: qty 1

## 2013-12-23 MED ORDER — SODIUM CHLORIDE 0.9 % IV BOLUS (SEPSIS)
1000.0000 mL | Freq: Once | INTRAVENOUS | Status: AC
  Administered 2013-12-23: 1000 mL via INTRAVENOUS

## 2013-12-23 MED ORDER — KETOROLAC TROMETHAMINE 30 MG/ML IJ SOLN
30.0000 mg | Freq: Once | INTRAMUSCULAR | Status: AC
  Administered 2013-12-23: 30 mg via INTRAVENOUS
  Filled 2013-12-23: qty 1

## 2013-12-23 NOTE — Discharge Instructions (Signed)
Viral Pharyngitis Viral pharyngitis is a viral infection that produces redness, pain, and swelling (inflammation) of the throat. It can spread from person to person (contagious). CAUSES Viral pharyngitis is caused by inhaling a large amount of certain germs called viruses. Many different viruses cause viral pharyngitis. SYMPTOMS Symptoms of viral pharyngitis include:  Sore throat.  Tiredness.  Stuffy nose.  Low-grade fever.  Congestion.  Cough. TREATMENT Treatment includes rest, drinking plenty of fluids, and the use of over-the-counter medication (approved by your caregiver). HOME CARE INSTRUCTIONS   Drink enough fluids to keep your urine clear or pale yellow.  Eat soft, cold foods such as ice cream, frozen ice pops, or gelatin dessert.  Gargle with warm salt water (1 tsp salt per 1 qt of water).  If over age 497, throat lozenges may be used safely.  Only take over-the-counter or prescription medicines for pain, discomfort, or fever as directed by your caregiver. Do not take aspirin. To help prevent spreading viral pharyngitis to others, avoid:  Mouth-to-mouth contact with others.  Sharing utensils for eating and drinking.  Coughing around others. SEEK MEDICAL CARE IF:   You are better in a few days, then become worse.  You have a fever or pain not helped by pain medicines.  There are any other changes that concern you. Document Released: 08/21/2005 Document Revised: 02/03/2012 Document Reviewed: 01/17/2011 [facility] Patient Information 2014 [place], [place].   Upper Respiratory Infection, Adult An upper respiratory infection (URI) is also sometimes known as the common cold. The upper respiratory tract includes the nose, sinuses, throat, trachea, and bronchi. Bronchi are the airways leading to the lungs. Most people improve within 1 week, but symptoms can last up to 2 weeks. A residual cough may last even longer.  CAUSES Many different viruses can infect the  tissues lining the upper respiratory tract. The tissues become irritated and inflamed and often become very moist. Mucus production is also common. A cold is contagious. You can easily spread the virus to others by oral contact. This includes kissing, sharing a glass, coughing, or sneezing. Touching your mouth or nose and then touching a surface, which is then touched by another person, can also spread the virus. SYMPTOMS  Symptoms typically develop 1 to 3 days after you come in contact with a cold virus. Symptoms vary from person to person. They may include:  Runny nose.  Sneezing.  Nasal congestion.  Sinus irritation.  Sore throat.  Loss of voice (laryngitis).  Cough.  Fatigue.  Muscle aches.  Loss of appetite.  Headache.  Low-grade fever. DIAGNOSIS  You might diagnose your own cold based on familiar symptoms, since most people get a cold 2 to 3 times a year. Your caregiver can confirm this based on your exam. Most importantly, your caregiver can check that your symptoms are not due to another disease such as strep throat, sinusitis, pneumonia, asthma, or epiglottitis. Blood tests, throat tests, and X-rays are not necessary to diagnose a common cold, but they may sometimes be helpful in excluding other more serious diseases. Your caregiver will decide if any further tests are required. RISKS AND COMPLICATIONS  You may be at risk for a more severe case of the common cold if you smoke cigarettes, have chronic heart disease (such as heart failure) or lung disease (such as asthma), or if you have a weakened immune system. The very young and very old are also at risk for more serious infections. Bacterial sinusitis, middle ear infections, and bacterial pneumonia can  complicate the common cold. The common cold can worsen asthma and chronic obstructive pulmonary disease (COPD). Sometimes, these complications can require emergency medical care and may be life-threatening. PREVENTION  The  best way to protect against getting a cold is to practice good hygiene. Avoid oral or hand contact with people with cold symptoms. Wash your hands often if contact occurs. There is no clear evidence that vitamin C, vitamin E, echinacea, or exercise reduces the chance of developing a cold. However, it is always recommended to get plenty of rest and practice good nutrition. TREATMENT  Treatment is directed at relieving symptoms. There is no cure. Antibiotics are not effective, because the infection is caused by a virus, not by bacteria. Treatment may include:  Increased fluid intake. Sports drinks offer valuable electrolytes, sugars, and fluids.  Breathing heated mist or steam (vaporizer or shower).  Eating chicken soup or other clear broths, and maintaining good nutrition.  Getting plenty of rest.  Using gargles or lozenges for comfort.  Controlling fevers with ibuprofen or acetaminophen as directed by your caregiver.  Increasing usage of your inhaler if you have asthma. Zinc gel and zinc lozenges, taken in the first 24 hours of the common cold, can shorten the duration and lessen the severity of symptoms. Pain medicines may help with fever, muscle aches, and throat pain. A variety of non-prescription medicines are available to treat congestion and runny nose. Your caregiver can make recommendations and may suggest nasal or lung inhalers for other symptoms.  HOME CARE INSTRUCTIONS   Only take over-the-counter or prescription medicines for pain, discomfort, or fever as directed by your caregiver.  Use a warm mist humidifier or inhale steam from a shower to increase air moisture. This may keep secretions moist and make it easier to breathe.  Drink enough water and fluids to keep your urine clear or pale yellow.  Rest as needed.  Return to work when your temperature has returned to normal or as your caregiver advises. You may need to stay home longer to avoid infecting others. You can also  use a face mask and careful hand washing to prevent spread of the virus. SEEK MEDICAL CARE IF:   After the first few days, you feel you are getting worse rather than better.  You need your caregiver's advice about medicines to control symptoms.  You develop chills, worsening shortness of breath, or brown or red sputum. These may be signs of pneumonia.  You develop yellow or brown nasal discharge or pain in the face, especially when you bend forward. These may be signs of sinusitis.  You develop a fever, swollen neck glands, pain with swallowing, or white areas in the back of your throat. These may be signs of strep throat. SEEK IMMEDIATE MEDICAL CARE IF:   You have a fever.  You develop severe or persistent headache, ear pain, sinus pain, or chest pain.  You develop wheezing, a prolonged cough, cough up blood, or have a change in your usual mucus (if you have chronic lung disease).  You develop sore muscles or a stiff neck. Document Released: 05/07/2001 Document Revised: 02/03/2012 Document Reviewed: 03/15/2011 [facility] Patient Information 2014 [place], [place].

## 2013-12-23 NOTE — ED Provider Notes (Signed)
CSN: [id number]     Arrival date & time 12/23/13  [id number] History   First MD Initiated Contact with Patient 12/23/13 [phone]     Chief Complaint  Patient presents with  . Fever  . Weakness   (Consider location/radiation/quality/duration/timing/severity/associated sxs/prior Treatment) HPI Comments: Jacqueline Andrews is a 23 y.o. female who complains of sore throat for 3 days. She denies a history of chest pain, chills, fevers, myalgias, shortness of breath, sweats, vomiting. Pt was seen at an urgent care 2 days ago - flu swab was neg and strep was neg. She states that despite taking motrin, she still gets fevers and pain. She has reduced po intake due to the pain - no drooling, resp issues and difficulty opening mouth. She also has generalized malaise and weakness.       The history is provided by the patient.    Past Medical History  Diagnosis Date  . Spina bifida   . Vaginal agenesis, total   . Congenital fusion of kidneys   . Depression   . Anxiety    Past Surgical History  Procedure Laterality Date  . Diagnostic laparoscopy    . Ovarian cyst removal     History reviewed. No pertinent family history. History  Substance Use Topics  . Smoking status: Never Smoker   . Smokeless tobacco: Never Used  . Alcohol Use: No   OB History   Grav Para Term Preterm Abortions TAB SAB Ect Mult Living                 Review of Systems  Constitutional: Positive for fever and fatigue. Negative for chills.  HENT: Positive for sore throat and voice change. Negative for ear pain and trouble swallowing.   Eyes: Negative for itching.  Respiratory: Negative for cough and shortness of breath.   Cardiovascular: Negative for chest pain.  Gastrointestinal: Negative for nausea, vomiting and abdominal pain.  Genitourinary: Negative for dysuria.  Musculoskeletal: Negative for neck pain.  Neurological: Negative for headaches.    Allergies  Fentanyl; Midazolam hcl; Antihistamines,  chlorpheniramine-type; Oxycodone hcl; and Sulfa antibiotics  Home Medications   Current Outpatient Rx  Name  Route  Sig  Dispense  Refill  . FLUoxetine (PROZAC) 20 MG tablet   Oral   Take 20 mg by mouth daily.         Marland Kitchen. lamoTRIgine (LAMICTAL) 25 MG tablet   Oral   Take 1 tablet (25 mg total) by mouth at bedtime. For mood stabilization.   30 tablet   0   . meclizine (ANTIVERT) 12.5 MG tablet   Oral   Take 12.5 mg by mouth every 4 (four) hours as needed for dizziness.         . Multiple Vitamin (MULTIVITAMIN WITH MINERALS) TABS tablet   Oral   Take 1 tablet by mouth daily.         . traZODone (DESYREL) 50 MG tablet      Take one tablet at bedtime if needed for insomnia.   30 tablet   0    BP 124/82  Pulse 100  Temp(Src) 102.4 F (39.1 C) (Oral)  Resp 18  SpO2 96% Physical Exam  Nursing note and vitals reviewed. Constitutional: She is oriented to person, place, and time. She appears well-developed and well-nourished.  HENT:  Head: Normocephalic and atraumatic.  Mouth/Throat: Oropharyngeal exudate present.  Eyes: EOM are normal. Pupils are equal, round, and reactive to light.  Neck: Normal range of motion. Neck supple.  Cardiovascular: Normal rate, regular rhythm and normal heart sounds.   No murmur heard. Pulmonary/Chest: Effort normal. No stridor. No respiratory distress.  Abdominal: Soft. She exhibits no distension. There is no tenderness. There is no rebound and no guarding.  Lymphadenopathy:    She has no cervical adenopathy.  Neurological: She is alert and oriented to person, place, and time.  Skin: Skin is warm and dry.    ED Course  Procedures (including critical care time) Labs Review Labs Reviewed - No data to display Imaging Review No results found.  EKG Interpretation   None       MDM  No diagnosis found.  Pt comes in with cc of sore throat and weakness with fevers.  DDX includes: Viral syndrome Strep  Pharyngitis Sinusitis Mononucleosis Electrolyte abnormality  Exam shows exudative tonsillitis and pharyngitis.  Pt has no enlarged spleen - so not screening for mono.  IVF x 2 liters given, decadron given. No air way compromise, and currently no hard signs of deep infection of the neck - but we have discussed return precautions and importance of hydration.  Derwood Kaplan, MD 12/23/13 1247

## 2013-12-23 NOTE — ED Notes (Signed)
Patient given water and ginger ale per patient's choice for fluid challenge.

## 2013-12-23 NOTE — ED Notes (Signed)
Pt presents to ED with c/o persistent fever despite taking Ibuprofen and Benadryl, and progressive weakness.  Pt was seen here last Tuesday and was diagnosed with pharyngitis.  Pt reports that sore throat is getting worse; pt also c/o dizziness, headache, general body aches.

## 2013-12-25 LAB — CULTURE, GROUP A STREP

## 2014-02-25 ENCOUNTER — Telehealth: Payer: Self-pay | Admitting: *Deleted

## 2014-02-25 NOTE — Telephone Encounter (Signed)
Called and confirmed 04/01/14 genetic appt w/ pt.  Mailed welcoming packet & calendar to pt.

## 2014-04-01 ENCOUNTER — Other Ambulatory Visit: Payer: Self-pay

## 2014-08-10 IMAGING — US US ABDOMEN COMPLETE
1 series · 13 of 25 positions shown · non-contrast
Comparison: None.

CLINICAL DATA: Abdominal pain.  Epigastric pain.

ABDOMINAL ULTRASOUND COMPLETE

[Series 1: us abdomen complete · 0.28mm/px · 13 of 71 slices shown]
[im 1/71]
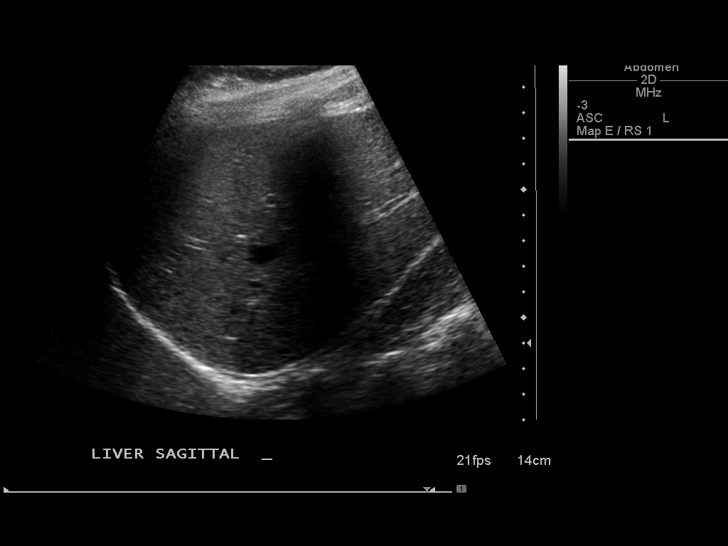
[im 6/71]
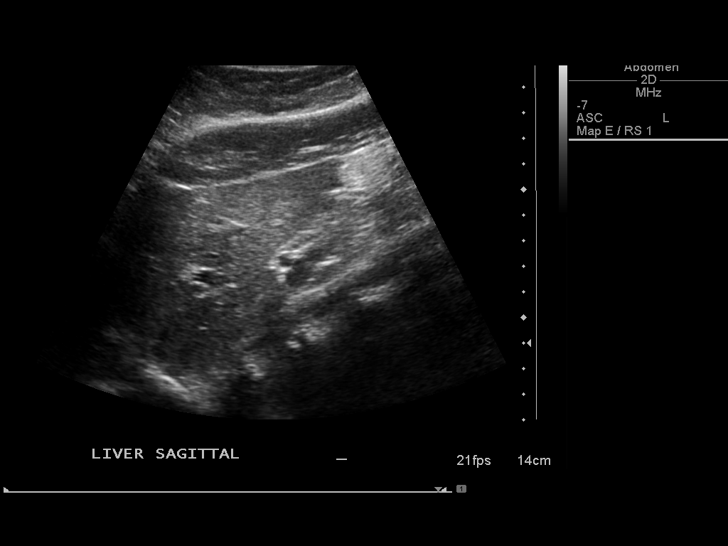
[im 12/71]
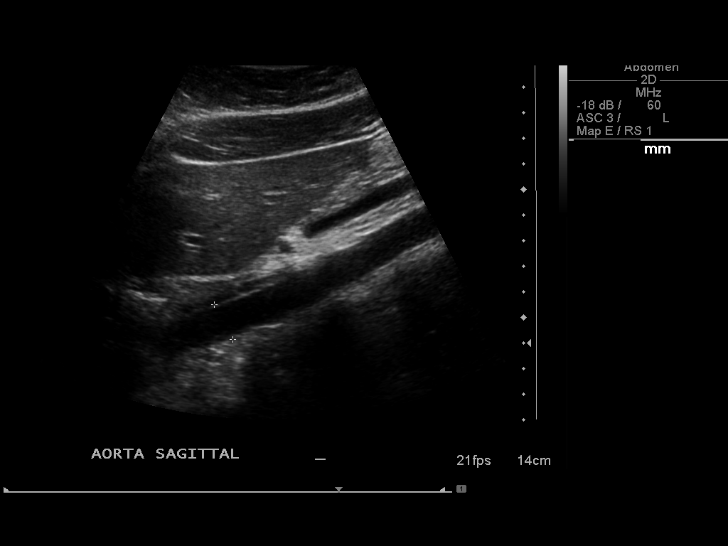
[im 18/71]
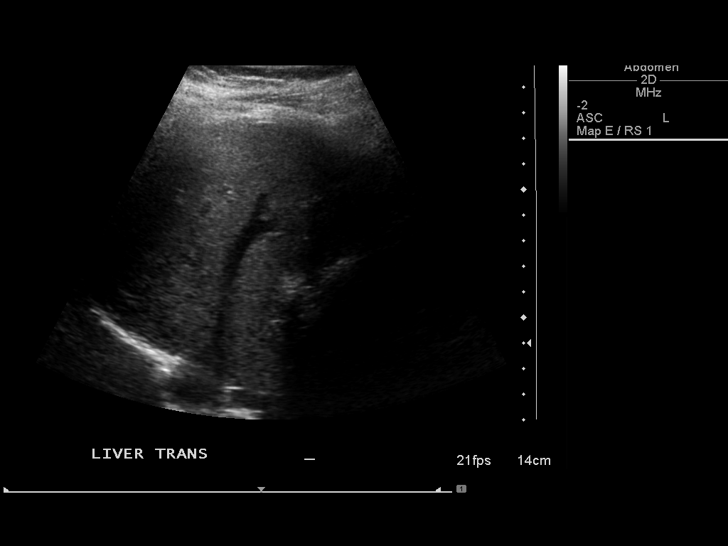
[im 24/71]
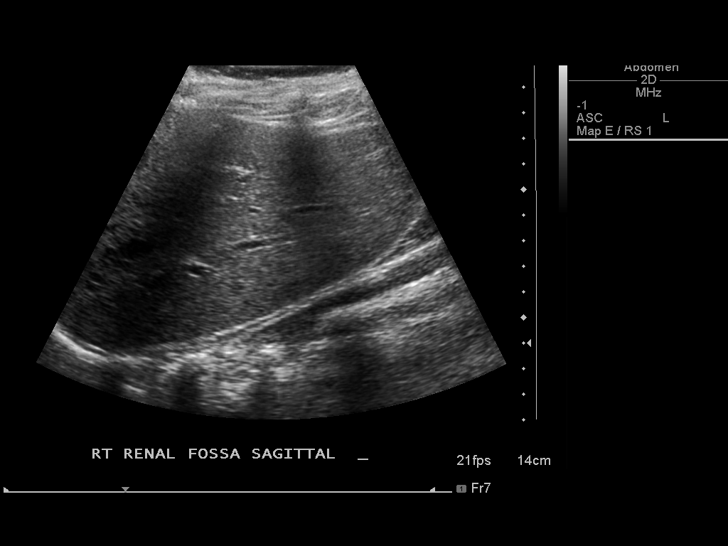
[im 30/71]
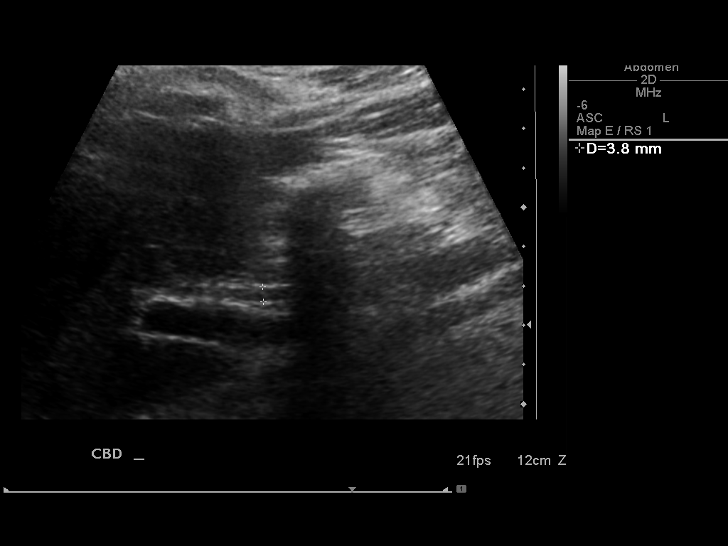
[im 36/71]
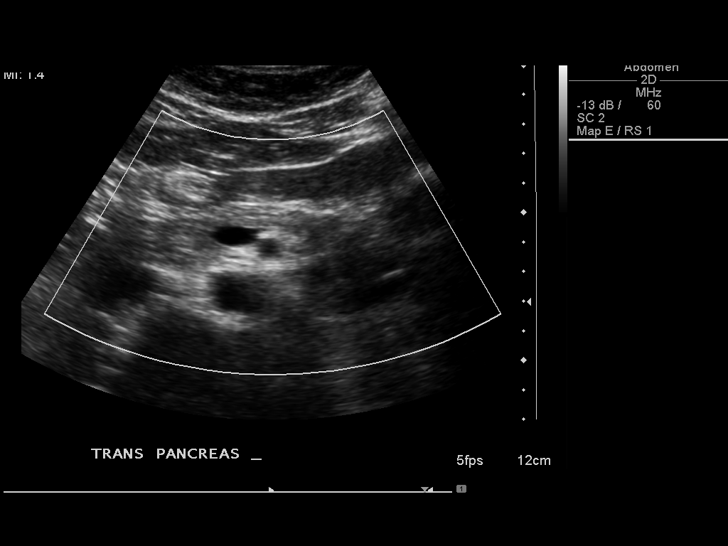
[im 41/71]
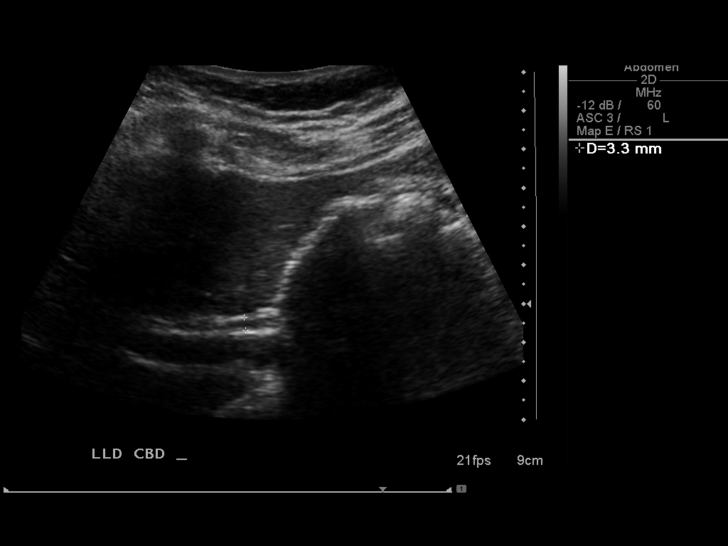
[im 47/71]
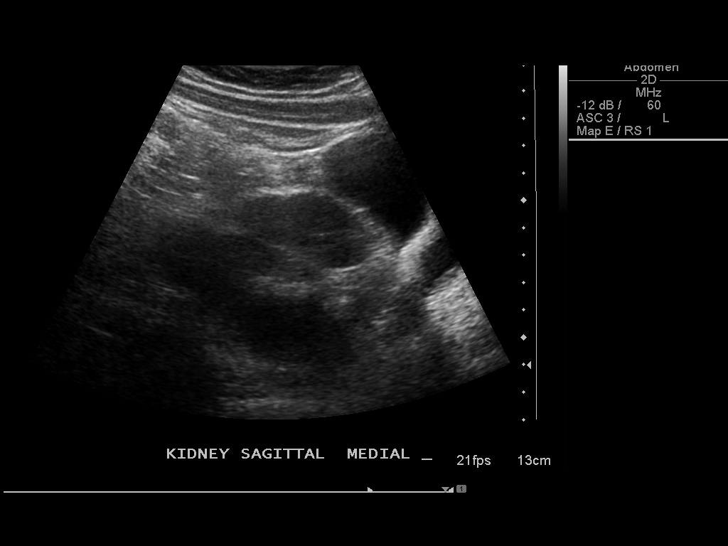
[im 53/71]
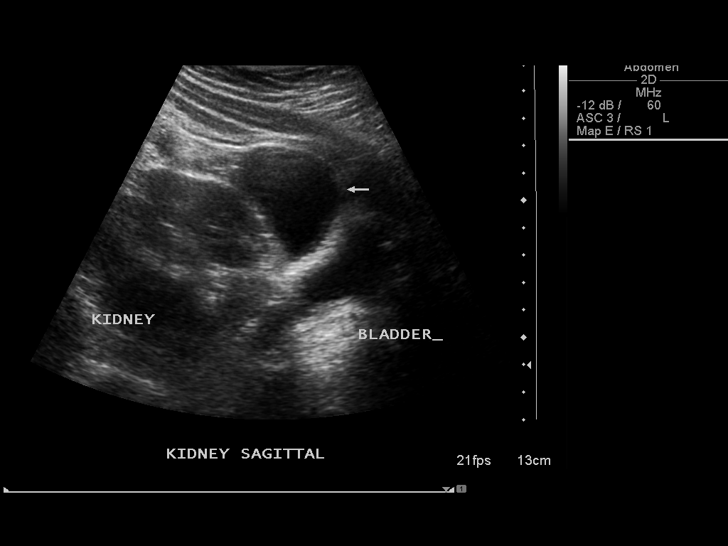
[im 59/71]
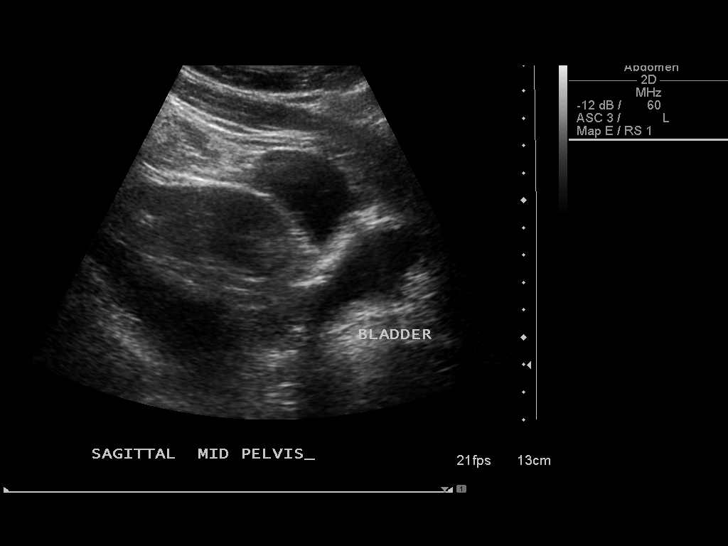
[im 65/71]
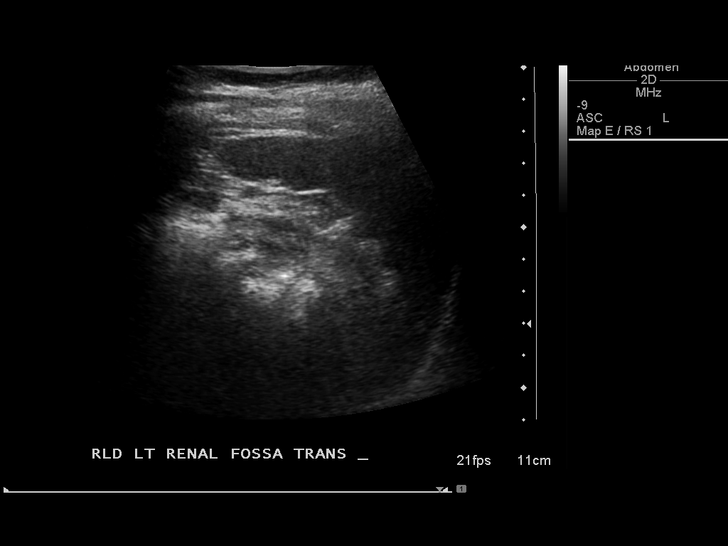
[im 71/71]
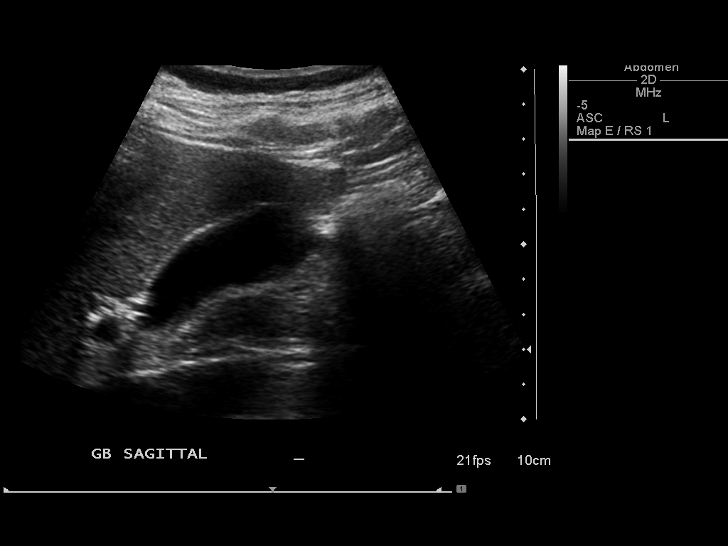

[13 of 25 positions shown; findings below may reference images not displayed]

FINDINGS: Gallbladder:  No gallstones, gallbladder wall thickening, or
pericholecystic fluid.

Common Bile Duct:  Within normal limits in caliber. Measures 4 mm
in diameter.

Liver: No focal mass lesion identified.  Within normal limits in
parenchymal echogenicity.

IVC:  Appears normal.

Pancreas:  No abnormality identified.

Spleen:  Within normal limits in size and echotexture.

Right kidney:  Not visualized.

Left kidney:  Not visualized.

A pelvic kidney is seen which measures 10.3 cm length.  The pelvic
kidney shows normal parenchymal echogenicity.  No renal mass or
hydronephrosis identified.  A a simple appearing cyst is seen which
is located between the pelvic kidney and urinary bladder which
measures 4.3 x 4.1 x 6.4 cm.  This has benign features but is of
uncertain origin.

Abdominal Aorta:  No aneurysm identified.
IMPRESSION: 1.  No evidence of cholelithiasis or biliary dilatation.
2.  Absence of normal kidneys, with single pelvic kidney
visualized.  No evidence of renal mass or hydronephrosis.
3.  6 cm simple appearing cyst between the pelvic kidney and
urinary bladder.  This has benign features but is of uncertain
origin.  This could represent an ovarian cyst.

## 2014-09-14 ENCOUNTER — Encounter (HOSPITAL_COMMUNITY): Payer: Self-pay | Admitting: Emergency Medicine

## 2014-09-14 ENCOUNTER — Ambulatory Visit: Payer: BC Managed Care – PPO

## 2014-09-14 ENCOUNTER — Emergency Department (HOSPITAL_COMMUNITY)
Admission: EM | Admit: 2014-09-14 | Discharge: 2014-09-14 | Disposition: A | Discharge status: Home / Self Care | Payer: BC Managed Care – PPO | Attending: Emergency Medicine | Admitting: Emergency Medicine

## 2014-09-14 DIAGNOSIS — F329 Major depressive disorder, single episode, unspecified: Secondary | ICD-10-CM | POA: Insufficient documentation

## 2014-09-14 DIAGNOSIS — F419 Anxiety disorder, unspecified: Secondary | ICD-10-CM | POA: Insufficient documentation

## 2014-09-14 DIAGNOSIS — K648 Other hemorrhoids: Secondary | ICD-10-CM

## 2014-09-14 DIAGNOSIS — Z79899 Other long term (current) drug therapy: Secondary | ICD-10-CM | POA: Insufficient documentation

## 2014-09-14 DIAGNOSIS — D751 Secondary polycythemia: Secondary | ICD-10-CM

## 2014-09-14 LAB — URINALYSIS, ROUTINE W REFLEX MICROSCOPIC
Bilirubin Urine: NEGATIVE
Glucose, UA: NEGATIVE mg/dL
Hgb urine dipstick: NEGATIVE
Ketones, ur: NEGATIVE mg/dL
Leukocytes, UA: NEGATIVE
Nitrite: NEGATIVE
Protein, ur: NEGATIVE mg/dL
Specific Gravity, Urine: 1.037 — ABNORMAL HIGH (ref 1.005–1.030)
Urobilinogen, UA: 0.2 mg/dL (ref 0.0–1.0)
pH: 5 (ref 5.0–8.0)

## 2014-09-14 LAB — I-STAT CHEM 8, ED
BUN: 8 mg/dL (ref 6–23)
CHLORIDE: 102 meq/L (ref 96–112)
Calcium, Ion: 1.18 mmol/L (ref 1.12–1.23)
Creatinine, Ser: 0.7 mg/dL (ref 0.50–1.10)
Glucose, Bld: 85 mg/dL (ref 70–99)
HEMATOCRIT: 46 % (ref 36.0–46.0)
Hemoglobin: 15.6 g/dL — ABNORMAL HIGH (ref 12.0–15.0)
POTASSIUM: 3.8 meq/L (ref 3.7–5.3)
SODIUM: 138 meq/L (ref 137–147)
TCO2: 26 mmol/L (ref 0–100)

## 2014-09-14 LAB — POC OCCULT BLOOD, ED: Fecal Occult Bld: POSITIVE — AB

## 2014-09-14 MED ORDER — HYDROCORTISONE ACETATE 10 % RE FOAM
1.0000 | Freq: Two times a day (BID) | RECTAL | Status: AC
Start: 2014-09-14 — End: ?

## 2014-09-14 NOTE — ED Provider Notes (Signed)
Medical screening examination/treatment/procedure(s) were conducted as a shared visit with non-physician practitioner(s) or resident and myself. I personally evaluated the patient during the encounter and agree with the findings.  I have personally reviewed any xrays and/ or EKG's with the provider and I agree with interpretation.  Patient with intermittent k or rectal bleeding for 2-3 days without bowel movements. Patient had milder similar episode in the past, has a history of hemorrhoids. No colonoscopy history. No blood thinners. On exam well-appearing, abdomen soft nontender. Rectal exam per physician assistant. Hemoglobin normal, with age/history of present illness/exam most likely hemorrhoid. Discussed outpatient followup for colonoscopy if no improvement.  Rectal bleeding   Jacqueline SkeensJoshua M Sharvi Mooneyhan, MD 09/14/14 2350

## 2014-09-14 NOTE — ED Provider Notes (Signed)
CSN: [id number]     Arrival date & time 09/14/14  1315 History   First MD Initiated Contact with Patient 09/14/14 1605     Chief Complaint  Patient presents with  . Rectal Bleeding     (Consider location/radiation/quality/duration/timing/severity/associated sxs/prior Treatment) HPI  Jacqueline Andrews Is a 23 year old female who presents to the emergency department with chief complaint of rectal bleeding. She has a history of spina bifida is noted and vaginal agenesis. Patient last week had a urinary tract infection symptoms which included hematuria, frequency and urgency. She took a natural product called D-mannose for several days with resolution of her symptoms. Associated symptoms at that time included nausea, pelvic pain, back pain, malaise. 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 She did have some mild associated low back pain. She denies a history of urinary tract infections. Has had no reconstructive surgeries and does not have intercourse. She has history of previous bleeding hemorrhoids however, to have been bleeding heavily for the past few days. She is having bleeding without bowel movements. She denies any weakness, dizziness, palpitations or syncope.  Past Medical History  Diagnosis Date  . Spina bifida   . Vaginal agenesis, total   . Congenital fusion of kidneys   . Depression   . Anxiety    Past Surgical History  Procedure Laterality Date  . Diagnostic laparoscopy    . Ovarian cyst removal     History reviewed. No pertinent family history. History  Substance Use Topics  . Smoking status: Never Smoker   . Smokeless tobacco: Never Used  . Alcohol Use: No   OB History   Grav Para Term Preterm Abortions TAB SAB Ect Mult Living                 Review of Systems  Ten systems reviewed and are negative for acute change, except as noted in the HPI.    Allergies  Fentanyl; Midazolam hcl; Antihistamines, chlorpheniramine-type; Oxycodone  hcl; and Sulfa antibiotics  Home Medications   Prior to Admission medications   Medication Sig Start Date End Date Taking? Authorizing Provider  FLUoxetine (PROZAC) 20 MG tablet Take 20 mg by mouth daily.    Historical Provider, MD  ibuprofen (ADVIL,MOTRIN) 600 MG tablet Take 1 tablet (600 mg total) by mouth every 6 (six) hours as needed. 12/23/13   Derwood KaplanAnkit Nanavati, MD  lamoTRIgine (LAMICTAL) 25 MG tablet Take 1 tablet (25 mg total) by mouth at bedtime. For mood stabilization. 09/27/13   Verne SpurrNeil Mashburn, PA-C  meclizine (ANTIVERT) 12.5 MG tablet Take 12.5 mg by mouth every 4 (four) hours as needed for dizziness.    Historical Provider, MD  Multiple Vitamin (MULTIVITAMIN WITH MINERALS) TABS tablet Take 1 tablet by mouth daily.    Historical Provider, MD  traZODone (DESYREL) 50 MG tablet Take one tablet at bedtime if needed for insomnia. 09/27/13   Verne SpurrNeil Mashburn, PA-C   BP 122/80  Pulse 74  Temp(Src) 97.3 F (36.3 C) (Oral)  Resp 18  Ht 5\' 5"  (1.651 m)  Wt 162 lb (73.483 kg)  BMI 26.96 kg/m2  SpO2 98% Physical Exam  Constitutional: She is oriented to person, place, and time. She appears well-developed and well-nourished. No distress.  HENT:  Head: Normocephalic and atraumatic.  Eyes: Conjunctivae are normal. No scleral icterus.  Neck: Normal range of motion.  Cardiovascular: Normal rate, regular rhythm and normal heart sounds.  Exam reveals no gallop and no friction rub.   No  murmur heard. Pulmonary/Chest: Effort normal and breath sounds normal. No respiratory distress.  Abdominal: Soft. Bowel sounds are normal. She exhibits no distension and no mass. There is no tenderness. There is no guarding.  Genitourinary:  Digital Rectal Exam reveals sphincter with good tone. No external hemorrhoids. No masses or fissures. Stool color is brown with no overt blood.   Neurological: She is alert and oriented to person, place, and time.  Skin: Skin is warm and dry. She is not diaphoretic.    ED  Course  Procedures (including critical care time) Labs Review Labs Reviewed  I-STAT CHEM 8, ED - Abnormal; Notable for the following:    Hemoglobin 15.6 (*)    All other components within normal limits  URINALYSIS, ROUTINE W REFLEX MICROSCOPIC  POC OCCULT BLOOD, ED    Imaging Review No results found.   EKG Interpretation None      MDM   Final diagnoses:  Bleeding internal hemorrhoids  Polycythemia    Patient with elevated hgb. Discomfort without overt pain on DRE. No visible blood. No fissures or masses. Doubt  Abscess or infection. Urine is negative  Will discharge the patient with proctofoam and gi/pcp consult.    Arthor CaptainAbigail Onesti Bonfiglio, PA-C 09/14/14 1754

## 2014-09-14 NOTE — ED Notes (Signed)
Writer present with provider when rectal exam done

## 2014-09-14 NOTE — Discharge Instructions (Signed)
Your  Exam revealed bleeding hemorrhoids. You hemoglobin is elevated so your bleeding is minimal and not causing hemorrhoids. Follow up with a primary care doctor or [facility]. You may need your blood level rechecked to see if your hemoglobin is remaining elevated. Your urine was clear and no infection was found. If you develop fevers, chills, nausea, vomiting.  Please follow closely at the emergency department for a ct scan of the abdomen.  Hemorrhoids Hemorrhoids are swollen veins around the rectum or anus. There are two types of hemorrhoids:   Internal hemorrhoids. These occur in the veins just inside the rectum. They may poke through to the outside and become irritated and painful.  External hemorrhoids. These occur in the veins outside the anus and can be felt as a painful swelling or hard lump near the anus. CAUSES  Pregnancy.   Obesity.   Constipation or diarrhea.   Straining to have a bowel movement.   Sitting for long periods on the toilet.  Heavy lifting or other activity that caused you to strain.  Anal intercourse. SYMPTOMS   Pain.   Anal itching or irritation.   Rectal bleeding.   Fecal leakage.   Anal swelling.   One or more lumps around the anus.  DIAGNOSIS  Your caregiver may be able to diagnose hemorrhoids by visual examination. Other examinations or tests that may be performed include:   Examination of the rectal area with a gloved hand (digital rectal exam).   Examination of anal canal using a small tube (scope).   A blood test if you have lost a significant amount of blood.  A test to look inside the colon (sigmoidoscopy or colonoscopy). TREATMENT Most hemorrhoids can be treated at home. However, if symptoms do not seem to be getting better or if you have a lot of rectal bleeding, your caregiver may perform a procedure to help make the hemorrhoids get smaller or remove them completely. Possible treatments include:    Placing a rubber band at the base of the hemorrhoid to cut off the circulation (rubber band ligation).   Injecting a chemical to shrink the hemorrhoid (sclerotherapy).   Using a tool to burn the hemorrhoid (infrared light therapy).   Surgically removing the hemorrhoid (hemorrhoidectomy).   Stapling the hemorrhoid to block blood flow to the tissue (hemorrhoid stapling).  HOME CARE INSTRUCTIONS   Eat foods with fiber, such as whole grains, beans, nuts, fruits, and vegetables. Ask your doctor about taking products with added fiber in them (fibersupplements).  Increase fluid intake. Drink enough water and fluids to keep your urine clear or pale yellow.   Exercise regularly.   Go to the bathroom when you have the urge to have a bowel movement. Do not wait.   Avoid straining to have bowel movements.   Keep the anal area dry and clean. Use wet toilet paper or moist towelettes after a bowel movement.   Medicated creams and suppositories may be used or applied as directed.   Only take over-the-counter or prescription medicines as directed by your caregiver.   Take warm sitz baths for 15-20 minutes, 3-4 times a day to ease pain and discomfort.   Place ice packs on the hemorrhoids if they are tender and swollen. Using ice packs between sitz baths may be helpful.   Put ice in a plastic bag.   Place a towel between your skin and the bag.   Leave the ice on for 15-20 minutes, 3-4 times a day.   Do  not use a donut-shaped pillow or sit on the toilet for long periods. This increases blood pooling and pain.  SEEK MEDICAL CARE IF:  You have increasing pain and swelling that is not controlled by treatment or medicine.  You have uncontrolled bleeding.  You have difficulty or you are unable to have a bowel movement.  You have pain or inflammation outside the area of the hemorrhoids. MAKE SURE YOU:  Understand these instructions.  Will watch your condition.  Will  get help right away if you are not doing well or get worse. Document Released: 11/08/2000 Document Revised: 10/28/2012 Document Reviewed: 09/15/2012 [facility] Patient Information 2015 [place], [place]. This information is not intended to replace advice given to you by your health care provider. Make sure you discuss any questions you have with your health care provider.

## 2014-09-14 NOTE — ED Notes (Signed)
Pt reports having rectal bleeding x 2-3 days, denies abd pain. Has hx of hemorrhoids. No acute distress noted at triage.
